# Patient Record
Sex: Female | Born: 1955 | Race: White | Hispanic: No | Marital: Married | Smoking: Never smoker
Health system: Southern US, Community
[De-identification: ages and names within clinical notes are randomized; demographics above are authoritative.]

## PROBLEM LIST (undated history)

## (undated) DIAGNOSIS — E785 Hyperlipidemia, unspecified: Secondary | ICD-10-CM

## (undated) DIAGNOSIS — F419 Anxiety disorder, unspecified: Secondary | ICD-10-CM

## (undated) DIAGNOSIS — Z789 Other specified health status: Secondary | ICD-10-CM

## (undated) DIAGNOSIS — F329 Major depressive disorder, single episode, unspecified: Secondary | ICD-10-CM

## (undated) DIAGNOSIS — F32A Depression, unspecified: Secondary | ICD-10-CM

---
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
## 1997-08-08 ENCOUNTER — Other Ambulatory Visit: Admission: RE | Admit: 1997-08-08 | Discharge: 1997-08-08 | Payer: Self-pay | Admitting: Gynecology

## 1998-02-22 ENCOUNTER — Emergency Department (HOSPITAL_COMMUNITY): Admission: EM | Admit: 1998-02-22 | Discharge: 1998-02-22 | Payer: Self-pay | Admitting: Emergency Medicine

## 1998-12-16 ENCOUNTER — Other Ambulatory Visit: Admission: RE | Admit: 1998-12-16 | Discharge: 1998-12-16 | Payer: Self-pay | Admitting: Gynecology

## 2000-01-18 ENCOUNTER — Other Ambulatory Visit: Admission: RE | Admit: 2000-01-18 | Discharge: 2000-01-18 | Payer: Self-pay | Admitting: Gynecology

## 2000-06-02 ENCOUNTER — Encounter (INDEPENDENT_AMBULATORY_CARE_PROVIDER_SITE_OTHER): Payer: Self-pay | Admitting: *Deleted

## 2000-06-02 ENCOUNTER — Ambulatory Visit (HOSPITAL_COMMUNITY): Admission: RE | Admit: 2000-06-02 | Discharge: 2000-06-02 | Payer: Self-pay | Admitting: Gastroenterology

## 2001-01-10 ENCOUNTER — Other Ambulatory Visit: Admission: RE | Admit: 2001-01-10 | Discharge: 2001-01-10 | Payer: Self-pay | Admitting: Gynecology

## 2002-01-25 ENCOUNTER — Other Ambulatory Visit: Admission: RE | Admit: 2002-01-25 | Discharge: 2002-01-25 | Payer: Self-pay | Admitting: Gynecology

## 2003-07-31 ENCOUNTER — Other Ambulatory Visit: Admission: RE | Admit: 2003-07-31 | Discharge: 2003-07-31 | Payer: Self-pay | Admitting: Gynecology

## 2004-08-20 ENCOUNTER — Other Ambulatory Visit: Admission: RE | Admit: 2004-08-20 | Discharge: 2004-08-20 | Payer: Self-pay | Admitting: Gynecology

## 2004-09-24 ENCOUNTER — Ambulatory Visit (HOSPITAL_COMMUNITY): Admission: RE | Admit: 2004-09-24 | Discharge: 2004-09-24 | Payer: Self-pay | Admitting: Gynecology

## 2004-09-24 ENCOUNTER — Encounter (INDEPENDENT_AMBULATORY_CARE_PROVIDER_SITE_OTHER): Payer: Self-pay | Admitting: *Deleted

## 2005-08-30 ENCOUNTER — Other Ambulatory Visit: Admission: RE | Admit: 2005-08-30 | Discharge: 2005-08-30 | Payer: Self-pay | Admitting: Gynecology

## 2006-03-09 ENCOUNTER — Ambulatory Visit (HOSPITAL_COMMUNITY): Admission: RE | Admit: 2006-03-09 | Discharge: 2006-03-09 | Payer: Self-pay | Admitting: Gastroenterology

## 2006-03-09 ENCOUNTER — Encounter (INDEPENDENT_AMBULATORY_CARE_PROVIDER_SITE_OTHER): Payer: Self-pay | Admitting: Specialist

## 2006-11-03 ENCOUNTER — Other Ambulatory Visit: Admission: RE | Admit: 2006-11-03 | Discharge: 2006-11-03 | Payer: Self-pay | Admitting: Gynecology

## 2007-05-30 ENCOUNTER — Other Ambulatory Visit: Admission: RE | Admit: 2007-05-30 | Discharge: 2007-05-30 | Payer: Self-pay | Admitting: Gynecology

## 2007-11-28 ENCOUNTER — Other Ambulatory Visit: Admission: RE | Admit: 2007-11-28 | Discharge: 2007-11-28 | Payer: Self-pay | Admitting: Gynecology

## 2009-01-16 ENCOUNTER — Ambulatory Visit (HOSPITAL_COMMUNITY): Admission: RE | Admit: 2009-01-16 | Discharge: 2009-01-16 | Payer: Self-pay | Admitting: Gynecology

## 2009-02-09 ENCOUNTER — Ambulatory Visit (HOSPITAL_COMMUNITY): Admission: RE | Admit: 2009-02-09 | Discharge: 2009-02-09 | Payer: Self-pay | Admitting: Urology

## 2009-12-16 ENCOUNTER — Ambulatory Visit: Payer: Self-pay | Admitting: Family Medicine

## 2009-12-16 DIAGNOSIS — M21619 Bunion of unspecified foot: Secondary | ICD-10-CM | POA: Insufficient documentation

## 2009-12-16 DIAGNOSIS — M79609 Pain in unspecified limb: Secondary | ICD-10-CM | POA: Insufficient documentation

## 2010-01-12 ENCOUNTER — Ambulatory Visit: Payer: Self-pay | Admitting: Sports Medicine

## 2010-02-02 ENCOUNTER — Ambulatory Visit: Payer: Self-pay | Admitting: Sports Medicine

## 2010-02-02 DIAGNOSIS — M2170 Unequal limb length (acquired), unspecified site: Secondary | ICD-10-CM

## 2010-05-26 NOTE — Assessment & Plan Note (Signed)
Summary: f/u,mc   Vital Signs:  Patient profile:   55 year old female Pulse rate:   66 / minute BP sitting:   113 / 71  (left arm)  Vitals Entered By: Rochele Pages RN (February 02, 2010 11:42 AM) CC: f/u lt foot pain   CC:  f/u lt foot pain.  History of Present Illness: Patient her to follow up on Left foot pain which she reports is 75% improved.   She continues to have intermittent pain with increased walking.  She has not started running yet. She is wearing the toe spacers whenever she has on enclosed shoes.   Physical Exam  General:  Well-developed,well-nourished,in no acute distress; alert,appropriate and cooperative throughout examination Msk:  Foot:  Bunions bilaterally. No other swelling evident. Increased breakdown of longitudinal arch on L foot, but breakdown in both. Pronation of L foot on standing.  No pain on flexion, extension of toes bilaterally. Mild pain to deep palpation at 2nd MTP joint, but much improved.  Leg length: 1.25 cm discrepancy L>R Neurologic:  Gait: Pelvic tilt with R lower than L. Corrected with inserts.   Impression & Recommendations:  Problem # 1:  FOOT PAIN, LEFT (ICD-729.5) Pain improved. Patient give sports insoles to take pressure off bunion and correct leg length discrepancy. She will continue to use toe spacers until swelling has been gone for 1 month. Patient will try some light walking, increasing gradually over 1-2 weeks. If she is without pain, she can then try walk/jog. 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  Problem # 3:  BUNIONS, BILATERAL (ICD-727.1)  Orders: Sports Insoles (N5621)   keep up toe spacers

## 2010-05-26 NOTE — Assessment & Plan Note (Signed)
Summary: F/U,MC   Vital Signs:  Patient profile:   55 year old female BP sitting:   129 / 84  Vitals Entered By: Lillia Pauls CMA (January 12, 2010 12:02 PM)  History of Present Illness: 1. F/U left foot pain:  Pt seen in clinic about 4 weeks ago with left foot pain and diagnosed with 2nd MTP joint capsulitis and was put in a post op shoe.  She thinks that she is a little bit better.  Main improvement is that she is not in pain as often as she was before.  The pain is still on the plantar aspect of her foot at the 2nd MTP.  Hurts to put pressure on that spot and when pushing off when walking.  Social History: Reviewed history from 12/16/2009 and no changes required. diet[facility], [facility]  Physical Exam  General:  vitals reviewed.  well appearing, no acute distress Msk:  Left foot: ttp over dorsal aspect of 2nd MTP joint and with loading the joint.  Full ROM of the joint to flexion and extension.  No obvious swelling, erythema, or deformity.  Pain reproduced with resisted 2nd toe flexion.  Bilateral bunions Calluses at the distal point of the second toes   Additional Exam:  U/S exam:  moderate 2nd MTP joint effusion, greater on the left compared to the right.  No evidence of stress fracture.   Impression & Recommendations:  Problem # 1:  FOOT PAIN, LEFT (ICD-729.5) Assessment Improved  Slightly improved compared to last visit.  Still consistent with 2 MTP capsulitis.  Provided pt with toe spacers for both feet.  This seemed to take some of the pressure off of the second toe.  Will have pt come back for orthotics.  Orders: [place] LIMITED (16109)  Problem # 2:  BUNIONS, BILATERAL (ICD-727.1) her gait shows pronation at forefoot  prob needs to get into custom  orthotic  OK to wean out of cast shoe as tolerated

## 2010-05-26 NOTE — Assessment & Plan Note (Signed)
Summary: NP,FOOT PAIN,MC   Vital Signs:  Patient profile:   55 year old female Height:      62.5 inches Weight:      115 pounds BMI:     20.77 BP sitting:   117 / 77  Vitals Entered By: Enid Baas MD (December 16, 2009 9:53 AM)  History of Present Illness: Pt here for left foot pain for 3 months. She has some pain at the 2nd met. She is in a running club. She is running about 3 miles, 3 times a week. She notes the outside edge of her tread is worn. She notes her pain is when she starts running and tapers off then it hurts when she stops running.  primarily, the patient complained of pain with flexion and extension of the second metatarsal phalangeal joint. No pain along the shaft of the metatarsal. No significant swelling. No ecchymosis. No distinct from that she can recall.  Prominent bunion.  Social History: dietitian, [facility]  Review of Systems General:  Denies chills, fatigue, fever, loss of appetite, malaise, sleep disorder, sweats, weakness, and weight loss. MS:  Complains of joint pain; denies joint redness, joint swelling, loss of strength, low back pain, mid back pain, muscle aches, muscle , cramps, muscle weakness, stiffness, and thoracic pain.  Physical Exam  General:  Well-developed,well-nourished,in no acute distress; alert,appropriate and cooperative throughout examination Msk:  Ankle: ttp over 2nd MTP joint and with loading the joint  No visible erythema or swelling. Range of motion is full in all directions. Strength is 5/5 in all directions. Stable lateral and medial ligaments; squeeze test and kleiger test unremarkable; Talar dome nontender; No pain at base of 5th MT; No tenderness over cuboid; No tenderness over N spot or navicular prominence No tenderness on posterior aspects of lateral and medial malleolus No sign of peroneal tendon subluxations; Negative tarsal tunnel tinel's Able to walk 4 steps.   Neurologic:  No cranial nerve deficits  noted. Station and gait are normal. Plantar reflexes are down-going bilaterally. DTRs are symmetrical throughout. Sensory, motor and coordinative functions appear intact.   Impression & Recommendations:  Problem # 1:  FOOT PAIN, LEFT (ICD-729.5)  Likely capsulitis of the 2nd MTP joint. 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 She is instructed to wear the post op shoe for the next few weeks and f/u then. Hopefully that will have calmed down to be able to consider orthotics at that point. She needs to use the shoe when she is at work primarily and to stop running for now. She can cross train if she wishes. Conservative mgmt of bunions for now.  Orders: Post-op Shoe (L3260)  Problem # 2:  BUNIONS, BILATERAL (ICD-727.1)

## 2010-05-28 ENCOUNTER — Other Ambulatory Visit: Payer: Self-pay | Admitting: Gastroenterology

## 2010-05-28 ENCOUNTER — Ambulatory Visit (HOSPITAL_COMMUNITY)
Admission: RE | Admit: 2010-05-28 | Discharge: 2010-05-28 | Disposition: A | Discharge status: Home / Self Care | Payer: 59 | Source: Ambulatory Visit | Attending: Gastroenterology | Admitting: Gastroenterology

## 2010-05-28 DIAGNOSIS — D126 Benign neoplasm of colon, unspecified: Secondary | ICD-10-CM | POA: Insufficient documentation

## 2010-06-09 NOTE — Op Note (Signed)
  NAME:  SHERE, EISENHART NO.:  [id number]  MEDICAL RECORD NO.:  [id number]           PATIENT TYPE:  O  LOCATION:  [facility]                         FACILITY:  [facility]  PHYSICIAN:  Danise Edge, M.D.   DATE OF BIRTH:  [birth year]  DATE OF PROCEDURE:  05/28/2010 DATE OF DISCHARGE:                              OPERATIVE REPORT   PROCEDURE:  Surveillance colonosocpy with colon polyps removal.  REFERRING PHYSICIAN:  Thora Lance, M.D.  HISTORY:  Ms. Barbara Greene is a 55 year old female scheduled to undergo surveillance colonoscopy with polypectomy to prevent colon cancer.  She underwent colonoscopies in 2002 and in 2007.  Small adenomatous colon polyps were removed during each colonoscopy.  ENDOSCOPIST:  Danise Edge, M.D.  PREMEDICATIONS: 1. Fentanyl 100 mcg. 2. Versed 7 mg.  DESCRIPTION OF PROCEDURE:  After obtaining informed consent, the patient was placed in the left lateral decubitus position.  I administered intravenous fentanyl and intravenous Versed to achieve conscious sedation for the procedure.  The patient's blood pressure, oxygen saturation and cardiac rhythm were monitored throughout the procedure and documented in the medical record.  Anal inspection and digital rectal exam were normal.  The Pentax pediatric colonoscope was introduced into the rectum and easily advanced to the cecum.  A normal-appearing ileocecal valve and appendiceal orifice were identified.  Colonic preparation for the exam today was good.  Rectum normal.  Retroflex view of the distal rectum normal.  Sigmoid colon:  From the mid sigmoid colon, a 3-mm sessile polyp was removed with cold biopsy forceps.  Descending colon normal.  Splenic flexure normal.  Transverse colon normal.  Hepatic flexure normal.  Ascending colon normal.  Cecum and ileocecal valve normal.  ASSESSMENT: 1. A small polyp was removed from the mid sigmoid colon with cold     biopsy forceps. 2.  Otherwise normal surveillance proctocolonoscopy to the cecum.  RECOMMENDATIONS:  Repeat surveillance colonoscopy in approximately 5 years.          ______________________________ Danise Edge, M.D.     MJ/MEDQ  D:  05/28/2010  T:  05/28/2010  Job:  [id number]  cc:   Thora Lance, M.D. Fax: [phone]  Electronically Signed by Danise Edge M.D. on 06/09/2010 02:37:55 PM

## 2010-09-11 NOTE — Op Note (Signed)
NAME:  LENAH, MESSENGER            ACCOUNT NO.:  [id number]   MEDICAL RECORD NO.:  [id number]          PATIENT TYPE:  AMB   LOCATION:  DAY                          [facility]ACILITY[facility]  [facility]   PHYSICIAN:  Howard C. Mezer, M.D.  DATE OF BIRTH:  [birth year]   DATE OF PROCEDURE:  09/24/2004  DATE OF DISCHARGE:                                 OPERATIVE REPORT   PREOPERATIVE DIAGNOSES:  Endometrial polyp with menorrhagia.   POSTOPERATIVE DIAGNOSES:  Endometrial polyp with menorrhagia.   OPERATION PERFORMED:  Hysteroscopy D&C.   SURGEON:  Leatha Gilding. Mezer, M.D.   ANESTHESIA:  MAC plus paracervical block.   PREPARATION:  Betadine.   DESCRIPTION OF PROCEDURE:  With the patient in the lithotomy position, she  was prepped and draped in routine fashion. A paracervical block of 1%  Xylocaine was placed without difficulty. The cervix was somewhat difficult  to dilate as there was a false passage that was encountered with each of the  dilators but easily avoided. The hysteroscope was introduced and [place] was  used a distention medium. The endocervical canal appeared to be normal and  visualization of the endometrial cavity was less than optimal secondary to  equipment. The view was quite hazy resulting in poor quality pictures. A  good feel was obtained, however, for the endometrial cavity. There was a  small amount of polypoid appearing tissue although the cavity was somewhat  irregular suggestive of fibroids. The hysteroscope was removed, the cavity  sharply curetted productive of a moderate amount of tissue. The cavity was  quite irregular on curettage. Randall stone forceps were introduced and  minimal additional tissue was identified. There was minimal bleeding at the  end of the procedure. The estimated blood loss was less than 20 mL. The  sponge, needle and instrument counts were correct. The patient tolerated the  procedure well and was taken to the recovery room in satisfactory  condition.      HCM/MEDQ  D:  09/24/2004  T:  09/24/2004  Job:  [id number]   cc:   Thora Lance, M.D.  [address]  [place]  [place] [address]  Fax: [phone]

## 2012-08-23 ENCOUNTER — Other Ambulatory Visit: Payer: Self-pay | Admitting: Dermatology

## 2013-04-16 ENCOUNTER — Other Ambulatory Visit: Payer: Self-pay | Admitting: Dermatology

## 2013-07-16 ENCOUNTER — Other Ambulatory Visit: Payer: Self-pay | Admitting: Gynecology

## 2013-11-28 ENCOUNTER — Other Ambulatory Visit: Payer: Self-pay | Admitting: Gynecology

## 2013-11-30 LAB — CYTOLOGY - PAP

## 2014-07-25 ENCOUNTER — Other Ambulatory Visit: Payer: Self-pay | Admitting: Gynecology

## 2014-07-26 LAB — CYTOLOGY - PAP

## 2015-01-01 ENCOUNTER — Other Ambulatory Visit: Payer: Self-pay | Admitting: Gynecology

## 2015-01-02 LAB — CYTOLOGY - PAP

## 2015-01-09 ENCOUNTER — Other Ambulatory Visit: Payer: Self-pay | Admitting: Urology

## 2015-01-09 DIAGNOSIS — N281 Cyst of kidney, acquired: Secondary | ICD-10-CM

## 2015-01-20 ENCOUNTER — Ambulatory Visit (HOSPITAL_COMMUNITY)
Admission: RE | Admit: 2015-01-20 | Discharge: 2015-01-20 | Disposition: A | Discharge status: Home / Self Care | Payer: 59 | Source: Ambulatory Visit | Attending: Urology | Admitting: Urology

## 2015-03-03 ENCOUNTER — Ambulatory Visit (HOSPITAL_COMMUNITY)
Admission: RE | Admit: 2015-03-03 | Discharge: 2015-03-03 | Disposition: A | Discharge status: Home / Self Care | Payer: 59 | Source: Ambulatory Visit | Attending: Urology | Admitting: Urology

## 2015-03-03 DIAGNOSIS — N289 Disorder of kidney and ureter, unspecified: Secondary | ICD-10-CM | POA: Insufficient documentation

## 2015-03-03 DIAGNOSIS — N281 Cyst of kidney, acquired: Secondary | ICD-10-CM | POA: Insufficient documentation

## 2015-03-03 LAB — POCT I-STAT CREATININE: CREATININE: 0.8 mg/dL (ref 0.44–1.00)

## 2015-03-03 MED ORDER — GADOBENATE DIMEGLUMINE 529 MG/ML IV SOLN
15.0000 mL | Freq: Once | INTRAVENOUS | Status: AC | PRN
  Administered 2015-03-03: 11 mL via INTRAVENOUS

## 2015-05-26 DIAGNOSIS — N281 Cyst of kidney, acquired: Secondary | ICD-10-CM | POA: Diagnosis not present

## 2015-05-26 DIAGNOSIS — R3121 Asymptomatic microscopic hematuria: Secondary | ICD-10-CM | POA: Diagnosis not present

## 2015-05-26 DIAGNOSIS — Z0000 Encounter for general adult medical examination without abnormal findings: Secondary | ICD-10-CM | POA: Diagnosis not present

## 2015-06-10 DIAGNOSIS — L308 Other specified dermatitis: Secondary | ICD-10-CM | POA: Diagnosis not present

## 2015-06-10 MED FILL — DESONIDE 0.05% OINTMENT: 0.05 | 30 days supply | Qty: 15 | Fill #0

## 2015-06-10 MED FILL — CLOBETASOL 0.05% OINTMENT: 0.05 | 20 days supply | Qty: 30 | Fill #0

## 2015-06-23 MED FILL — ESCITALOPRAM 10 MG TABLET: 10 | 90 days supply | Qty: 90 | Fill #0

## 2015-09-04 DIAGNOSIS — H2513 Age-related nuclear cataract, bilateral: Secondary | ICD-10-CM | POA: Diagnosis not present

## 2015-09-04 DIAGNOSIS — H35411 Lattice degeneration of retina, right eye: Secondary | ICD-10-CM | POA: Diagnosis not present

## 2015-09-04 DIAGNOSIS — D3131 Benign neoplasm of right choroid: Secondary | ICD-10-CM | POA: Diagnosis not present

## 2015-09-04 DIAGNOSIS — H35412 Lattice degeneration of retina, left eye: Secondary | ICD-10-CM | POA: Diagnosis not present

## 2015-09-05 DIAGNOSIS — Z1231 Encounter for screening mammogram for malignant neoplasm of breast: Secondary | ICD-10-CM | POA: Diagnosis not present

## 2015-09-05 DIAGNOSIS — Z13820 Encounter for screening for osteoporosis: Secondary | ICD-10-CM | POA: Diagnosis not present

## 2015-09-05 DIAGNOSIS — Z6822 Body mass index (BMI) 22.0-22.9, adult: Secondary | ICD-10-CM | POA: Diagnosis not present

## 2015-09-05 DIAGNOSIS — Z01419 Encounter for gynecological examination (general) (routine) without abnormal findings: Secondary | ICD-10-CM | POA: Diagnosis not present

## 2016-01-19 DIAGNOSIS — M21611 Bunion of right foot: Secondary | ICD-10-CM | POA: Diagnosis not present

## 2016-01-19 DIAGNOSIS — M21612 Bunion of left foot: Secondary | ICD-10-CM | POA: Diagnosis not present

## 2016-01-19 DIAGNOSIS — E559 Vitamin D deficiency, unspecified: Secondary | ICD-10-CM | POA: Diagnosis not present

## 2016-01-19 DIAGNOSIS — Z86010 Personal history of colonic polyps: Secondary | ICD-10-CM | POA: Diagnosis not present

## 2016-01-19 DIAGNOSIS — E7800 Pure hypercholesterolemia, unspecified: Secondary | ICD-10-CM | POA: Diagnosis not present

## 2016-01-19 DIAGNOSIS — Z0000 Encounter for general adult medical examination without abnormal findings: Secondary | ICD-10-CM | POA: Diagnosis not present

## 2016-01-20 MED FILL — ATORVASTATIN 40 MG TABLET: 40 | 30 days supply | Qty: 30 | Fill #0

## 2016-01-26 DIAGNOSIS — M21612 Bunion of left foot: Secondary | ICD-10-CM | POA: Diagnosis not present

## 2016-01-26 DIAGNOSIS — M2011 Hallux valgus (acquired), right foot: Secondary | ICD-10-CM | POA: Diagnosis not present

## 2016-01-26 DIAGNOSIS — M2012 Hallux valgus (acquired), left foot: Secondary | ICD-10-CM | POA: Diagnosis not present

## 2016-01-26 DIAGNOSIS — M21611 Bunion of right foot: Secondary | ICD-10-CM | POA: Diagnosis not present

## 2016-01-29 ENCOUNTER — Other Ambulatory Visit: Payer: Self-pay | Admitting: Gastroenterology

## 2016-02-24 ENCOUNTER — Other Ambulatory Visit: Payer: Self-pay | Admitting: Gastroenterology

## 2016-03-01 ENCOUNTER — Encounter (HOSPITAL_COMMUNITY)
Admission: RE | Disposition: A | Discharge status: Home / Self Care | Payer: Self-pay | Source: Ambulatory Visit | Attending: Gastroenterology

## 2016-03-01 SURGERY — COLONOSCOPY WITH PROPOFOL
Anesthesia: Monitor Anesthesia Care

## 2016-03-04 ENCOUNTER — Other Ambulatory Visit: Payer: Self-pay | Admitting: Gastroenterology

## 2016-03-17 ENCOUNTER — Other Ambulatory Visit: Payer: Self-pay | Admitting: Gastroenterology

## 2016-03-23 MED FILL — ATORVASTATIN 40 MG TABLET: 40 | 90 days supply | Qty: 90 | Fill #1

## 2016-03-29 DIAGNOSIS — D1801 Hemangioma of skin and subcutaneous tissue: Secondary | ICD-10-CM | POA: Diagnosis not present

## 2016-03-29 DIAGNOSIS — Z85820 Personal history of malignant melanoma of skin: Secondary | ICD-10-CM | POA: Diagnosis not present

## 2016-03-29 DIAGNOSIS — L821 Other seborrheic keratosis: Secondary | ICD-10-CM | POA: Diagnosis not present

## 2016-03-29 DIAGNOSIS — D225 Melanocytic nevi of trunk: Secondary | ICD-10-CM | POA: Diagnosis not present

## 2016-03-29 DIAGNOSIS — D2272 Melanocytic nevi of left lower limb, including hip: Secondary | ICD-10-CM | POA: Diagnosis not present

## 2016-03-29 DIAGNOSIS — D224 Melanocytic nevi of scalp and neck: Secondary | ICD-10-CM | POA: Diagnosis not present

## 2016-04-07 DIAGNOSIS — S61451A Open bite of right hand, initial encounter: Secondary | ICD-10-CM | POA: Diagnosis not present

## 2016-04-07 DIAGNOSIS — W5501XA Bitten by cat, initial encounter: Secondary | ICD-10-CM | POA: Diagnosis not present

## 2016-04-07 MED FILL — AMOX-CLAV 500-125 MG TABLET: 500-125 | 10 days supply | Qty: 20 | Fill #0

## 2016-04-12 ENCOUNTER — Other Ambulatory Visit: Payer: Self-pay | Admitting: Orthopedic Surgery

## 2016-04-13 ENCOUNTER — Encounter (HOSPITAL_COMMUNITY)
Admission: RE | Disposition: A | Discharge status: Home / Self Care | Payer: Self-pay | Source: Ambulatory Visit | Attending: Gastroenterology

## 2016-04-13 SURGERY — COLONOSCOPY WITH PROPOFOL
Anesthesia: Monitor Anesthesia Care

## 2016-05-12 ENCOUNTER — Ambulatory Visit (HOSPITAL_BASED_OUTPATIENT_CLINIC_OR_DEPARTMENT_OTHER): Admit: 2016-05-12 | Payer: 59 | Admitting: Orthopedic Surgery

## 2016-05-12 ENCOUNTER — Encounter (HOSPITAL_BASED_OUTPATIENT_CLINIC_OR_DEPARTMENT_OTHER): Payer: Self-pay

## 2016-05-12 SURGERY — BUNIONECTOMY
Anesthesia: Choice | Laterality: Left

## 2016-06-03 MED FILL — SUPREP BOWEL PREP KIT: 17.5-3.13-1 | 1 days supply | Qty: 354 | Fill #0

## 2016-06-04 ENCOUNTER — Encounter (HOSPITAL_COMMUNITY): Payer: Self-pay | Admitting: *Deleted

## 2016-06-08 ENCOUNTER — Encounter (HOSPITAL_COMMUNITY): Payer: Self-pay

## 2016-06-08 ENCOUNTER — Ambulatory Visit (HOSPITAL_COMMUNITY)
Admission: RE | Admit: 2016-06-08 | Discharge: 2016-06-08 | Disposition: A | Discharge status: Home / Self Care | Payer: 59 | Source: Ambulatory Visit | Attending: Gastroenterology | Admitting: Gastroenterology

## 2016-06-08 ENCOUNTER — Ambulatory Visit (HOSPITAL_COMMUNITY): Payer: 59 | Admitting: Anesthesiology

## 2016-06-08 ENCOUNTER — Encounter (HOSPITAL_COMMUNITY)
Admission: RE | Disposition: A | Discharge status: Home / Self Care | Payer: Self-pay | Source: Ambulatory Visit | Attending: Gastroenterology

## 2016-06-08 DIAGNOSIS — M79672 Pain in left foot: Secondary | ICD-10-CM | POA: Diagnosis not present

## 2016-06-08 DIAGNOSIS — K635 Polyp of colon: Secondary | ICD-10-CM | POA: Diagnosis not present

## 2016-06-08 DIAGNOSIS — Z1211 Encounter for screening for malignant neoplasm of colon: Secondary | ICD-10-CM | POA: Insufficient documentation

## 2016-06-08 DIAGNOSIS — D125 Benign neoplasm of sigmoid colon: Secondary | ICD-10-CM | POA: Diagnosis not present

## 2016-06-08 DIAGNOSIS — K562 Volvulus: Secondary | ICD-10-CM | POA: Diagnosis not present

## 2016-06-08 DIAGNOSIS — Z85820 Personal history of malignant melanoma of skin: Secondary | ICD-10-CM | POA: Diagnosis not present

## 2016-06-08 DIAGNOSIS — E7800 Pure hypercholesterolemia, unspecified: Secondary | ICD-10-CM | POA: Insufficient documentation

## 2016-06-08 DIAGNOSIS — M21612 Bunion of left foot: Secondary | ICD-10-CM | POA: Diagnosis not present

## 2016-06-08 DIAGNOSIS — Z86010 Personal history of colonic polyps: Secondary | ICD-10-CM | POA: Insufficient documentation

## 2016-06-08 DIAGNOSIS — M21611 Bunion of right foot: Secondary | ICD-10-CM | POA: Diagnosis not present

## 2016-06-08 HISTORY — DX: Other specified health status: Z78.9

## 2016-06-08 HISTORY — PX: COLONOSCOPY WITH PROPOFOL: SHX5780

## 2016-06-08 SURGERY — COLONOSCOPY WITH PROPOFOL
Anesthesia: Monitor Anesthesia Care

## 2016-06-08 MED ORDER — PROPOFOL 10 MG/ML IV BOLUS
INTRAVENOUS | Status: AC
  Filled 2016-06-08: qty 20

## 2016-06-08 MED ORDER — LIDOCAINE 2% (20 MG/ML) 5 ML SYRINGE
INTRAMUSCULAR | Status: DC | PRN
  Administered 2016-06-08: 50 mg via INTRAVENOUS

## 2016-06-08 MED ORDER — LACTATED RINGERS IV SOLN
INTRAVENOUS | Status: DC | PRN
  Administered 2016-06-08: 09:00:00 via INTRAVENOUS

## 2016-06-08 MED ORDER — PROPOFOL 10 MG/ML IV BOLUS
INTRAVENOUS | Status: AC
  Filled 2016-06-08: qty 40

## 2016-06-08 MED ORDER — PROPOFOL 10 MG/ML IV BOLUS
INTRAVENOUS | Status: DC | PRN
  Administered 2016-06-08 (×2): 30 mg via INTRAVENOUS
  Administered 2016-06-08: 20 mg via INTRAVENOUS
  Administered 2016-06-08: 40 mg via INTRAVENOUS
  Administered 2016-06-08: 30 mg via INTRAVENOUS
  Administered 2016-06-08: 20 mg via INTRAVENOUS

## 2016-06-08 MED ORDER — PROPOFOL 500 MG/50ML IV EMUL
INTRAVENOUS | Status: DC | PRN
  Administered 2016-06-08: 100 ug/kg/min via INTRAVENOUS

## 2016-06-08 SURGICAL SUPPLY — 22 items

## 2016-06-08 NOTE — Anesthesia Preprocedure Evaluation (Signed)
Anesthesia Evaluation  Patient identified by MRN, date of birth, ID band Patient awake    Reviewed: Allergy & Precautions, NPO status , Patient's Chart, lab work & pertinent test results  Airway Mallampati: II  TM Distance: >3 FB Neck ROM: Full    Dental  (+) Dental Advisory Given   Pulmonary neg pulmonary ROS,    breath sounds clear to auscultation       Cardiovascular negative cardio ROS   Rhythm:Regular Rate:Normal     Neuro/Psych negative neurological ROS     GI/Hepatic negative GI ROS, Neg liver ROS,   Endo/Other  negative endocrine ROS  Renal/GU negative Renal ROS     Musculoskeletal   Abdominal   Peds  Hematology negative hematology ROS (+)   Anesthesia Other Findings   Reproductive/Obstetrics                             Anesthesia Physical Anesthesia Plan  ASA: I  Anesthesia Plan: MAC   Post-op Pain Management:    Induction: Intravenous  Airway Management Planned: Natural Airway and Simple Face Mask  Additional Equipment:   Intra-op Plan:   Post-operative Plan:   Informed Consent: I have reviewed the patients History and Physical, chart, labs and discussed the procedure including the risks, benefits and alternatives for the proposed anesthesia with the patient or authorized representative who has indicated his/her understanding and acceptance.     Plan Discussed with:   Anesthesia Plan Comments:         Anesthesia Quick Evaluation

## 2016-06-08 NOTE — H&P (Signed)
Procedure: Surveillance colonoscopy. 05/28/2010 normal surveillance colonoscopy was performed. History of small adenomatous colon polyps removed colonoscopically in 2002 and in 2007. Father was diagnosed with rectal cancer in his 70s. Sister was diagnosed with colon polyps  History: The patient is a 61 year old female born [birth year]. She is scheduled to undergo a surveillance colonoscopy today.  Past medical history: Hypercholesterolemia. Migraine headache syndrome. Cesarean sections performed in 1983 and 1987. 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  Medication allergies:  Exam: The patient is alert and lying comfortably on the endoscopy stretcher. Abdomen is soft and nontender to palpation. Lungs are clear to auscultation. Cardiac exam reveals a regular rhythm.  Proceed with surveillance colonoscopy

## 2016-06-08 NOTE — Anesthesia Postprocedure Evaluation (Signed)
Anesthesia Post Note  Patient: Barbara Greene  Procedure(s) Performed: Procedure(s) (LRB): COLONOSCOPY WITH PROPOFOL (N/A)  Patient location during evaluation: PACU Anesthesia Type: MAC Level of consciousness: awake and alert Pain management: pain level controlled Vital Signs Assessment: post-procedure vital signs reviewed and stable Respiratory status: spontaneous breathing, nonlabored ventilation, respiratory function stable and patient connected to nasal cannula oxygen Cardiovascular status: stable and blood pressure returned to baseline Anesthetic complications: no       Last Vitals:  Vitals:   06/08/16 0749 06/08/16 0945  BP: (!) 158/66 (!) 109/51  Pulse: 81 74  Resp: (!) 21 18  Temp: 36.7 C     Last Pain:  Vitals:   06/08/16 0749  TempSrc: Leatrice Jewels                 Tiajuana Amass

## 2016-06-08 NOTE — Discharge Instructions (Signed)

## 2016-06-08 NOTE — Op Note (Signed)
[facility] Patient Name: Barbara Greene Procedure Date: 06/08/2016 MRN: [id number] Attending MD: Garlan Fair , MD Date of Birth: [birth year] CSN: [id number] Age: 61 Admit Type: Outpatient Procedure:                Colonoscopy Indications:              High risk colon cancer surveillance: Personal                            history of non-advanced adenoma Providers:                Garlan Fair, MD, Cleda Daub, RN, Cletis Athens, Technician Referring MD:              Medicines:                Propofol per Anesthesia Complications:            No immediate complications. Estimated Blood Loss:     Estimated blood loss was minimal. Procedure:                Pre-Anesthesia Assessment:                           - Prior to the procedure, a History and Physical                            was performed, and patient medications and                            allergies were reviewed. The patient's tolerance of                            previous anesthesia was also reviewed. The risks                            and benefits of the procedure and the sedation                            options and risks were discussed with the patient.                            All questions were answered, and informed consent                            was obtained. Prior Anticoagulants: The patient has                            taken no previous anticoagulant or antiplatelet                            agents. ASA Grade Assessment: II - A patient with  mild systemic disease. After reviewing the risks                            and benefits, the patient was deemed in                            satisfactory condition to undergo the procedure.                           After obtaining informed consent, the colonoscope                            was passed under direct vision. Throughout the                            procedure, the  patient's blood pressure, pulse, and                            oxygen saturations were monitored continuously. The                            [id number]) scope was introduced through                            the anus and advanced to the the cecum, identified                            by appendiceal orifice and ileocecal valve. The                            colonoscopy was technically difficult and complex                            due to significant looping. The patient tolerated                            the procedure well. The quality of the bowel                            preparation was good. The appendiceal orifice and                            the rectum were photographed. Findings:      The perianal and digital rectal examinations were normal.      A 5 mm polyp was found in the mid sigmoid colon. The polyp was sessile.       The polyp was removed with a cold snare. Resection and retrieval were       complete.      The exam was otherwise without abnormality. Impression:               - One 5 mm polyp in the mid sigmoid colon, removed  with a cold snare. Resected and retrieved.                           - The examination was otherwise normal. Moderate Sedation:      N/A- Per Anesthesia Care Recommendation:           - Patient has a contact number available for                            emergencies. The signs and symptoms of potential                            delayed complications were discussed with the                            patient. Return to normal activities tomorrow.                            Written discharge instructions were provided to the                            patient.                           - Repeat colonoscopy in 5 years for surveillance.                           - Resume previous diet.                           - Continue present medications. Procedure Code(s):        --- Professional ---                            [id number], Colonoscopy, flexible; with removal of                            tumor(s), polyp(s), or other lesion(s) by snare                            technique Diagnosis Code(s):        --- Professional ---                           Z86.010, Personal history of colonic polyps                           D12.5, Benign neoplasm of sigmoid colon CPT copyright 2016 [employer]. All rights reserved. The codes documented in this report are preliminary and upon coder review may  be revised to meet current compliance requirements. Earle Gell, MD Garlan Fair, MD 06/08/2016 9:43:42 AM This report has been signed electronically. Number of Addenda: 0

## 2016-06-08 NOTE — Transfer of Care (Signed)
Immediate Anesthesia Transfer of Care Note  Patient: MONCIA ANNAS  Procedure(s) Performed: Procedure(s): COLONOSCOPY WITH PROPOFOL (N/A)  Patient Location: PACU  Anesthesia Type:MAC  Level of Consciousness: Patient easily awoken, sedated, comfortable, cooperative, following commands, responds to stimulation.   Airway & Oxygen Therapy: Patient spontaneously breathing, ventilating well, oxygen via simple oxygen mask.  Post-op Assessment: Report given to PACU RN, vital signs reviewed and stable, moving all extremities.   Post vital signs: Reviewed and stable.  Complications: No apparent anesthesia complications Last Vitals:  Vitals:   06/08/16 0749  BP: (!) 158/66  Pulse: 81  Resp: (!) 21  Temp: 36.7 C    Last Pain:  Vitals:   06/08/16 0749  TempSrc: Oral         Complications: No apparent anesthesia complications

## 2016-06-11 ENCOUNTER — Encounter (HOSPITAL_COMMUNITY): Payer: Self-pay | Admitting: Gastroenterology

## 2016-07-29 MED FILL — ATORVASTATIN 40 MG TABLET: 40 | 90 days supply | Qty: 90 | Fill #2

## 2016-08-03 DIAGNOSIS — H524 Presbyopia: Secondary | ICD-10-CM | POA: Diagnosis not present

## 2016-09-02 DIAGNOSIS — H35411 Lattice degeneration of retina, right eye: Secondary | ICD-10-CM | POA: Diagnosis not present

## 2016-09-02 DIAGNOSIS — D3131 Benign neoplasm of right choroid: Secondary | ICD-10-CM | POA: Diagnosis not present

## 2016-09-02 DIAGNOSIS — H35412 Lattice degeneration of retina, left eye: Secondary | ICD-10-CM | POA: Diagnosis not present

## 2016-09-02 DIAGNOSIS — H2513 Age-related nuclear cataract, bilateral: Secondary | ICD-10-CM | POA: Diagnosis not present

## 2016-09-09 DIAGNOSIS — R29898 Other symptoms and signs involving the musculoskeletal system: Secondary | ICD-10-CM | POA: Diagnosis not present

## 2016-09-09 DIAGNOSIS — Z79899 Other long term (current) drug therapy: Secondary | ICD-10-CM | POA: Diagnosis not present

## 2016-09-09 DIAGNOSIS — E7800 Pure hypercholesterolemia, unspecified: Secondary | ICD-10-CM | POA: Diagnosis not present

## 2016-09-09 DIAGNOSIS — M549 Dorsalgia, unspecified: Secondary | ICD-10-CM | POA: Diagnosis not present

## 2016-09-22 ENCOUNTER — Encounter: Payer: Self-pay | Admitting: Physical Therapy

## 2016-09-22 ENCOUNTER — Ambulatory Visit: Payer: 59 | Attending: Internal Medicine | Admitting: Physical Therapy

## 2016-09-22 DIAGNOSIS — M542 Cervicalgia: Secondary | ICD-10-CM

## 2016-09-22 DIAGNOSIS — M6281 Muscle weakness (generalized): Secondary | ICD-10-CM

## 2016-09-22 DIAGNOSIS — M62838 Other muscle spasm: Secondary | ICD-10-CM | POA: Diagnosis not present

## 2016-09-23 NOTE — Therapy (Signed)
[place], [place], [address] Phone: [phone]   Fax:  [phone]  Physical Therapy Evaluation  Patient Details  Name: Barbara Greene MRN: [id number] Date of Birth: [birth year] Referring Provider: Dr Lavone Orn   Encounter Date: 09/22/2016      PT End of Session - 09/23/16 1259    Visit Number 1   Number of Visits 12   Date for PT Re-Evaluation 11/04/16   Authorization Type [facility]    PT Start Time 1884   PT Stop Time 1720   PT Time Calculation (min) 45 min   Activity Tolerance Patient tolerated treatment well   Behavior During Therapy [facility] for tasks assessed/performed      Past Medical History:  Diagnosis Date  . Medical history non-contributory     Past Surgical History:  Procedure Laterality Date  . CESAREAN SECTION    . COLONOSCOPY WITH PROPOFOL N/A 06/08/2016   Procedure: COLONOSCOPY WITH PROPOFOL;  Surgeon: Garlan Fair, MD;  Location: [facility];  Service: Endoscopy;  Laterality: N/A;    There were no vitals filed for this visit.       Subjective Assessment - 09/22/16 1646    Subjective Patient reports over the past 3-4 months the patient had increased pain in her cervical spine and weakness into her left hand. She changed her jobs to a job were she sits more throughout the day. She has to reach for her keyboard.    Limitations --  using a keyboard for long periods    How long can you sit comfortably? deppends on her activity    How long can you stand comfortably? N/A    How long can you walk comfortably? N/A    Diagnostic tests N/A    Currently in Pain? Yes   Pain Score 3    Pain Location Arm   Pain Orientation Left   Pain Descriptors / Indicators Aching   Pain Type Chronic pain   Pain Radiating Towards  left arm into the thumb and forefinger   Pain Onset More than a month ago   Pain Frequency Constant   Aggravating Factors  use of the arm and using the computer     Pain Relieving Factors rest    Multiple Pain Sites No            [facility] PT Assessment - 09/23/16 0001      Assessment   Medical Diagnosis Cervical pain into mid back and left arm    Referring Provider Dr Lavone Orn    Onset Date/Surgical Date --  Early Arpil 16 late march    Hand Dominance Right   Next MD Visit 2019   Prior Therapy None      Precautions   Precautions None     Restrictions   Weight Bearing Restrictions No     Balance Screen   Has the patient fallen in the past 6 months No   Has the patient had a decrease in activity level because of a fear of falling?  No   Is the patient reluctant to leave their home because of a fear of falling?  No     Home Environment   Additional Comments Nothing pertinent      Prior Function   Level of Independence Independent   Vocation Full time employment   Vocation Requirements Works as Electrical engineer for [employer], [employer] ,      Associate Professor  Overall Cognitive Status Within Functional Limits for tasks assessed   Attention Focused   Focused Attention Appears intact   Memory Appears intact   Awareness Appears intact   Problem Solving Appears intact     Observation/Other Assessments   Focus on Therapeutic Outcomes (FOTO)  Not given by front      Sensation   Light Touch Appears Intact   Additional Comments reports some weakness and numbness at times into her lateral wrist.      Coordination   Gross Motor Movements are Fluid and Coordinated Yes   Fine Motor Movements are Fluid and Coordinated Yes     AROM   Cervical Flexion pain at end range    Cervical Extension pain at end range    Cervical - Right Rotation 70   Cervical - Left Rotation 50     PROM   Overall PROM Comments full PROM of shoulders      Strength   Overall Strength Comments right grip 60 lbs left 40 lbs  Left shoulder 5/5    Strength Assessment Site Shoulder   Right/Left Shoulder Right   Right Shoulder Flexion  4+/5   Right Shoulder ABduction 4+/5   Right Shoulder External Rotation 4+/5     Palpation   Palpation comment spasming of the left upper trap             Objective measurements completed on examination: See above findings.          [facility] Adult PT Treatment/Exercise - 09/23/16 0001      Neck Exercises: Standing   Other Standing Exercises trigger point release with tennis ball    Other Standing Exercises Shoulder extension yellow 2x10; scap retraction yellow 2x10      Manual Therapy   Manual therapy comments manual traction; trigger point release to cervical spine and upper trap.      Neck Exercises: Stretches   Upper Trapezius Stretch Limitations 2x20sec hold                 PT Education - 09/23/16 1256    Education provided Yes   Education Details HEP, symptom mamgement, improtance of postural strengthening    Person(s) Educated Patient   Methods Explanation;Demonstration;Tactile cues;Verbal cues   Comprehension Verbalized understanding;Returned demonstration;Verbal cues required;Tactile cues required          PT Short Term Goals - 09/23/16 1310      PT SHORT TERM GOAL #1   Title Patient will increase cervical rotation to the left by 10 degrees    Time 4   Period Weeks   Status New     PT SHORT TERM GOAL #2   Title Patient will demsotrate 5/5 gross right shoulder strength    Time 4   Period Weeks   Status New     PT SHORT TERM GOAL #3   Title Patient will demsotrate no significant tenderness to aplpation in her right upper trap   Time 4   Period Weeks   Status New     PT SHORT TERM GOAL #4   Title Patient will be independent with initial HEP    Time 4   Period Weeks   Status New           PT Long Term Goals - 09/23/16 1312      PT LONG TERM GOAL #1   Title Patient will demonstrate 5/5 scapualr strength in order to 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    Time  8   Period Weeks   Status New     PT LONG TERM GOAL #2    Title Patient will sit at her job for 4-5 hours without report of increased pain    Time 8   Period Weeks   Status New     PT LONG TERM GOAL #3   Title Patient will return to the gym with program to promote pusture    Time 8   Period Weeks   Status New     PT LONG TERM GOAL #4   Title Patient will set up her work station to promote proper posture while sitting    Time 8   Period Weeks   Status New                Plan - 09/23/16 1301    Clinical Impression Statement Patient is a 23 year year old female with left sided cervical/ mid back pain. She has weakness into her left hand. Her grip is 20ls difgferent on the left. At times she has impared sensation on the left. She has bene working on a computer more at work. Therapy reviewed her work space set up and gave her a sheet to evaluate her set up. she has minor weakness on the left compared to the right. She would benefit from skilled therapy to improve her ability to work without pain. She was seen for a low complexity evalaution.    Clinical Presentation Stable   Clinical Decision Making Low   Rehab Potential Good   PT Frequency 2x / week   PT Duration 8 weeks   PT Treatment/Interventions ADLs/Self Care Home Management;Cryotherapy;Electrical Stimulation;Iontophoresis 4mg /ml Dexamethasone;Gait training;Moist Heat;Ultrasound;Therapeutic activities;Therapeutic exercise;Patient/family education;Manual techniques;Taping;Stair training;Neuromuscular re-education;Passive range of motion   PT Next Visit Plan continue with scapualr exercises and postural correction; consdier bilateral ER with band; flexion with band abduction; Continue  manual therapy; Add standing fexion when able.    PT Home Exercise Plan scap retraction/ shoulder extension/ upper trap stretch, tennis ball trigger point release   Consulted and Agree with Plan of Care Patient      Patient will benefit from skilled therapeutic intervention in order to improve the  following deficits and impairments:  Decreased strength, Postural dysfunction, Decreased activity tolerance, Pain, Impaired UE functional use, Increased muscle spasms  Visit Diagnosis: Cervicalgia - Plan: PT plan of care cert/re-cert  Other muscle spasm - Plan: PT plan of care cert/re-cert  Muscle weakness (generalized) - Plan: PT plan of care cert/re-cert     Problem List Patient Active Problem List   Diagnosis Date Noted  . UNEQUAL LEG LENGTH 02/02/2010  . BUNIONS, BILATERAL 12/16/2009  . FOOT PAIN, LEFT 12/16/2009    [name] PT DPT  09/23/2016, 1:23 PM  [facility] [address] [place], [place], [address] Phone: [phone]   Fax:  [phone]  Name: Barbara Greene MRN: [id number] Date of Birth: [birth year]

## 2016-09-29 ENCOUNTER — Ambulatory Visit: Payer: 59 | Attending: Internal Medicine | Admitting: Physical Therapy

## 2016-09-29 DIAGNOSIS — M62838 Other muscle spasm: Secondary | ICD-10-CM | POA: Insufficient documentation

## 2016-09-29 DIAGNOSIS — M6281 Muscle weakness (generalized): Secondary | ICD-10-CM | POA: Diagnosis not present

## 2016-09-29 DIAGNOSIS — M542 Cervicalgia: Secondary | ICD-10-CM | POA: Diagnosis not present

## 2016-09-29 NOTE — Therapy (Signed)
[place], [place], [address] Phone: [phone]   Fax:  [phone]  Physical Therapy Treatment  Patient Details  Name: Barbara Greene MRN: [id number] Date of Birth: 13-Mar-1956 Referring Provider: Dr Lavone Orn   Encounter Date: 09/29/2016      PT End of Session - 09/29/16 1714    Visit Number 2   Number of Visits 12   Date for PT Re-Evaluation 11/04/16   PT Start Time 1634   PT Stop Time 1728   PT Time Calculation (min) 54 min   Activity Tolerance Patient tolerated treatment well   Behavior During Therapy [facility] for tasks assessed/performed      Past Medical History:  Diagnosis Date  . Medical history non-contributory     Past Surgical History:  Procedure Laterality Date  . CESAREAN SECTION    . COLONOSCOPY WITH PROPOFOL N/A 06/08/2016   Procedure: COLONOSCOPY WITH PROPOFOL;  Surgeon: Garlan Fair, MD;  Location: [facility];  Service: Endoscopy;  Laterality: N/A;    There were no vitals filed for this visit.                       [place] Adult PT Treatment/Exercise - 09/29/16 0001      Neck Exercises: Standing   Other Standing Exercises verball and demo correct technique for standing row and shoulder extension with band.      Shoulder Exercises: Supine   Other Supine Exercises Supine scapular stabilization exercises , HEP, 10 X with cues:  ER, Sash, both, narrow grip fleXion and ahorizontal abduction.  No pain with these however left shoulder had some pain free pops. no pops noted when I was depression area with palpations.      Moist Heat Therapy   Number Minutes Moist Heat 15 Minutes   Moist Heat Location Cervical                PT Education - 09/29/16 1714    Education provided Yes   Education Details ADLs, posture, HEP   Person(s) Educated Patient   Methods Explanation;Demonstration;Tactile cues;Verbal cues;Handout   Comprehension Verbalized  understanding;Returned demonstration;Need further instruction          PT Short Term Goals - 09/23/16 1310      PT SHORT TERM GOAL #1   Title Patient will increase cervical rotation to the left by 10 degrees    Time 4   Period Weeks   Status New     PT SHORT TERM GOAL #2   Title Patient will demsotrate 5/5 gross right shoulder strength    Time 4   Period Weeks   Status New     PT SHORT TERM GOAL #3   Title Patient will demsotrate no significant tenderness to aplpation in her right upper trap   Time 4   Period Weeks   Status New     PT SHORT TERM GOAL #4   Title Patient will be independent with initial HEP    Time 4   Period Weeks   Status New           PT Long Term Goals - 09/23/16 1312      PT LONG TERM GOAL #1   Title Patient will demonstrate 5/5 scapualr strength in order to 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    Time 8   Period Weeks   Status New     PT LONG TERM GOAL #2  Title Patient will sit at her job for 4-5 hours without report of increased pain    Time 8   Period Weeks   Status New     PT LONG TERM GOAL #3   Title Patient will return to the gym with program to promote pusture    Time 8   Period Weeks   Status New     PT LONG TERM GOAL #4   Title Patient will set up her work station to promote proper posture while sitting    Time 8   Period Weeks   Status New               Plan - 09/29/16 1719    Clinical Impression Statement Pain is decreasing when posture improves at work for short times. How ever some times she feel increased soreness.   Exercises require cues for technoique (minor).  posture improving.   Patient is open to any and all suggestions.    PT Treatment/Interventions ADLs/Self Care Home Management;Cryotherapy;Electrical Stimulation;Iontophoresis 4mg /ml Dexamethasone;Gait training;Moist Heat;Ultrasound;Therapeutic activities;Therapeutic exercise;Patient/family education;Manual techniques;Taping;Stair  training;Neuromuscular re-education;Passive range of motion   PT Next Visit Plan review all exercises as needed.  answer any body mechanics questions, check sleeping improved?  manual as needed.   PT Home Exercise Plan scap retraction/ shoulder extension/ upper trap stretch, tennis ball trigger point release, supine scapular stabilization.   Consulted and Agree with Plan of Care Patient      Patient will benefit from skilled therapeutic intervention in order to improve the following deficits and impairments:     Visit Diagnosis: Cervicalgia  Other muscle spasm  Muscle weakness (generalized)     Problem List Patient Active Problem List   Diagnosis Date Noted  . UNEQUAL LEG LENGTH 02/02/2010  . BUNIONS, BILATERAL 12/16/2009  . FOOT PAIN, LEFT 12/16/2009    Yidel Teuscher PTA 09/29/2016, 5:25 PM  [facility] [address] [place], [place], [address] Phone: [phone]   Fax:  [phone]  Name: Barbara Greene MRN: [id number] Date of Birth: Jun 13, 1955

## 2016-09-29 NOTE — Patient Instructions (Addendum)
Sleeping on Back  Place pillow under knees. A pillow with cervical support and a roll around waist are also helpful. Copyright  VHI. All rights reserved.  Sleeping on Side Place pillow between knees. Use cervical support under neck and a roll around waist as needed. Copyright  VHI. All rights reserved.   Sleeping on Stomach   If this is the only desirable sleeping position, place pillow under lower legs, and under stomach or chest as needed.  Posture - Sitting   Sit upright, head facing forward. Try using a roll to support lower back. Keep shoulders relaxed, and avoid rounded back. Keep hips level with knees. Avoid crossing legs for long periods. Stand to Sit / Sit to Stand   To sit: Bend knees to lower self onto front edge of chair, then scoot back on seat. To stand: Reverse sequence by placing one foot forward, and scoot to front of seat. Use rocking motion to stand up.   Work Height and Reach  Ideal work height is no more than 2 to 4 inches below elbow level when standing, and at elbow level when sitting. Reaching should be limited to arm's length, with elbows slightly bent.  Bending  Bend at hips and knees, not back. Keep feet shoulder-width apart.    Posture - Standing   Good posture is important. Avoid slouching and forward head thrust. Maintain curve in low back and align ears over shoul- ders, hips over ankles.  Alternating Positions   Alternate tasks and change positions frequently to reduce fatigue and muscle tension. Take rest breaks. Computer Work   Position work to face forward. Use proper work and seat height. Keep shoulders back and down, wrists straight, and elbows at right angles. Use chair that provides full back support. Add footrest and lumbar roll as needed.  Getting Into / Out of Car  Lower self onto seat, scoot back, then bring in one leg at a time. Reverse sequence to get out.  Dressing  Lie on back to pull socks or slacks over feet, or sit  and bend leg while keeping back straight.    Housework - Sink  Place one foot on ledge of cabinet under sink when standing at sink for prolonged periods.   Pushing / Pulling  Pushing is preferable to pulling. Keep back in proper alignment, and use leg muscles to do the work.  Deep Squat   Squat and lift with both arms held against upper trunk. Tighten stomach muscles without holding breath. Use smooth movements to avoid jerking.  Avoid Twisting   Avoid twisting or bending back. Pivot around using foot movements, and bend at knees if needed when reaching for articles.  Carrying Luggage   Distribute weight evenly on both sides. Use a cart whenever possible. Do not twist trunk. Move body as a unit.   Lifting Principles .Maintain proper posture and head alignment. .Slide object as close as possible before lifting. .Move obstacles out of the way. .Test before lifting; ask for help if too heavy. .Tighten stomach muscles without holding breath. .Use smooth movements; do not jerk. .Use legs to do the work, and pivot with feet. .Distribute the work load symmetrically and close to the center of trunk. .Push instead of pull whenever possible.   Ask For Help   Ask for help and delegate to others when possible. Coordinate your movements when lifting together, and maintain the low back curve.  Log Roll   Lying on back, bend left knee and place left   arm across chest. Roll all in one movement to the right. Reverse to roll to the left. Always move as one unit. Housework - Sweeping  Use long-handled equipment to avoid stooping.   Housework - Wiping  Position yourself as close as possible to reach work surface. Avoid straining your back.  Laundry - Unloading Wash   To unload small items at bottom of washer, lift leg opposite to arm being used to reach.  [place] close to area to be raked. Use arm movements to do the work. Keep back straight and avoid  twisting.     Cart  When reaching into cart with one arm, lift opposite leg to keep back straight.   Getting Into / Out of Bed  Lower self to lie down on one side by raising legs and lowering head at the same time. Use arms to assist moving without twisting. Bend both knees to roll onto back if desired. To sit up, start from lying on side, and use same move-ments in reverse. Housework - Vacuuming  Hold the vacuum with arm held at side. Step back and forth to move it, keeping head up. Avoid twisting.   Laundry - IT consultant so that bending and twisting can be avoided.   Laundry - Unloading Dryer  Squat down to reach into clothes dryer or use a reacher.  Gardening - Weeding / Probation officer or Kneel. Knee pads may be helpful.                     Over Head Pull: Narrow Grip       On back, knees bent, feet flat, band across thighs, elbows straight but relaxed. Pull hands apart (start). Keeping elbows straight, bring arms up and over head, hands toward floor. Keep pull steady on band. Hold momentarily. Return slowly, keeping pull steady, back to start. Repeat _10__ times. Band color __yellow____   Side Pull: Double Arm   On back, knees bent, feet flat. Arms perpendicular to body, shoulder level, elbows straight but relaxed. Pull arms out to sides, elbows straight. Resistance band comes across collarbones, hands toward floor. Hold momentarily. Slowly return to starting position. Repeat _10__ times. Band color _Yellow____   Sash   On back, knees bent, feet flat, left hand on left hip, right hand above left. Pull right arm DIAGONALLY (hip to shoulder) across chest. Bring right arm along head toward floor. Hold momentarily. Slowly return to starting position. Repeat __5_ times. Do with left arm. Band color _yellow_____   Shoulder Rotation: Double Arm   On back, knees bent, feet flat, elbows tucked at sides, bent 90, hands palms up.  Pull hands apart and down toward floor, keeping elbows near sides. Hold momentarily. Slowly return to starting position. Repeat _10__ times. Band color ___yellow___

## 2016-10-06 ENCOUNTER — Encounter: Payer: Self-pay | Admitting: Physical Therapy

## 2016-10-06 ENCOUNTER — Ambulatory Visit: Payer: 59 | Admitting: Physical Therapy

## 2016-10-06 DIAGNOSIS — M542 Cervicalgia: Secondary | ICD-10-CM

## 2016-10-06 DIAGNOSIS — M6281 Muscle weakness (generalized): Secondary | ICD-10-CM | POA: Diagnosis not present

## 2016-10-06 DIAGNOSIS — M62838 Other muscle spasm: Secondary | ICD-10-CM | POA: Diagnosis not present

## 2016-10-07 NOTE — Therapy (Signed)
[place], [place], [address] Phone: [phone]   Fax:  [phone]  Physical Therapy Treatment  Patient Details  Name: Barbara Greene MRN: [id number] Date of Birth: [birth year] Referring Provider: Dr Lavone Orn   Encounter Date: 10/06/2016      PT End of Session - 10/07/16 0952    Visit Number 3   Number of Visits 12   Date for PT Re-Evaluation 11/04/16   Authorization Type [facility]    PT Start Time 6720   PT Stop Time 1628   PT Time Calculation (min) 43 min   Activity Tolerance Patient tolerated treatment well   Behavior During Therapy [facility] for tasks assessed/performed      Past Medical History:  Diagnosis Date  . Medical history non-contributory     Past Surgical History:  Procedure Laterality Date  . CESAREAN SECTION    . COLONOSCOPY WITH PROPOFOL N/A 06/08/2016   Procedure: COLONOSCOPY WITH PROPOFOL;  Surgeon: Garlan Fair, MD;  Location: [facility];  Service: Endoscopy;  Laterality: N/A;    There were no vitals filed for this visit.      Subjective Assessment - 10/07/16 0947    Subjective Patient reports that she feels like at times she is better but then this morning she flet like her neck was tight again. She has adjusted her desk which has helped. she is doing all her exercises without asignificant increase in pain.    How long can you sit comfortably? deppends on her activity    How long can you stand comfortably? N/A    How long can you walk comfortably? N/A    Diagnostic tests N/A    Currently in Pain? Yes   Pain Score 2    Pain Location Shoulder   Pain Orientation Left   Pain Descriptors / Indicators Aching   Pain Type Chronic pain   Pain Radiating Towards left arm and into the thumb and fore finger    Pain Onset More than a month ago   Pain Frequency Constant   Aggravating Factors  use of the arm and using the computer    Pain Relieving Factors rest                           [facility] Adult PT Treatment/Exercise - 10/07/16 0001      Neck Exercises: Seated   Other Seated Exercise setaed cervical retraction 2x10      Shoulder Exercises: Supine   Other Supine Exercises Supine scapular stabilization exercises , HEP, 2x10 with cues:  ER, Sash, both, narrow grip fleXion and ahorizontal abduction.  No pain but continues to have crepitus in the shoulder. No pain. Advanced to red band.       Manual Therapy   Manual therapy comments manual traction; trigger point release to cervical spine and upper trap.      Neck Exercises: Stretches   Upper Trapezius Stretch Limitations 2x20sec hold                 PT Education - 10/07/16 0949    Education provided Yes   Education Details ADL's posture, HEP    Person(s) Educated Patient   Methods Explanation;Demonstration;Tactile cues;Verbal cues   Comprehension Verbalized understanding;Returned demonstration;Verbal cues required          PT Short Term Goals - 10/07/16 0957      PT SHORT TERM GOAL #1   Title Patient will  increase cervical rotation to the left by 10 degrees    Time 4   Period Weeks   Status On-going     PT SHORT TERM GOAL #2   Title Patient will demsotrate 5/5 gross right shoulder strength    Time 4   Period Weeks   Status On-going     PT SHORT TERM GOAL #3   Title Patient will demsotrate no significant tenderness to aplpation in her right upper trap   Time 4   Period Weeks   Status On-going     PT SHORT TERM GOAL #4   Title Patient will be independent with initial HEP    Time 4   Period Weeks   Status On-going           PT Long Term Goals - 09/23/16 1312      PT LONG TERM GOAL #1   Title Patient will demonstrate 5/5 scapualr strength in order to 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    Time 8   Period Weeks   Status New     PT LONG TERM GOAL #2   Title Patient will sit at her job for 4-5 hours without report of increased pain     Time 8   Period Weeks   Status New     PT LONG TERM GOAL #3   Title Patient will return to the gym with program to promote pusture    Time 8   Period Weeks   Status New     PT LONG TERM GOAL #4   Title Patient will set up her work station to promote proper posture while sitting    Time 8   Period Weeks   Status New               Plan - 10/07/16 0954    Clinical Impression Statement Patient continues oto have spasmingof her left upper trap. Therapy reviewed the benefits for dry needling but she declined for now. Therapy progressed the patient to a red band. Patient's grip strength was measured at 60 lbs on the left and right. Herr strength appears to be progressing.    Clinical Presentation Stable   Clinical Decision Making Low   Rehab Potential Good   PT Frequency 2x / week   PT Duration 8 weeks   PT Treatment/Interventions ADLs/Self Care Home Management;Cryotherapy;Electrical Stimulation;Iontophoresis 4mg /ml Dexamethasone;Gait training;Moist Heat;Ultrasound;Therapeutic activities;Therapeutic exercise;Patient/family education;Manual techniques;Taping;Stair training;Neuromuscular re-education;Passive range of motion   PT Next Visit Plan review all exercises as needed.  answer any body mechanics questions, check sleeping improved?  manual as needed.   PT Home Exercise Plan scap retraction/ shoulder extension/ upper trap stretch, tennis ball trigger point release, supine scapular stabilization.   Consulted and Agree with Plan of Care Patient      Patient will benefit from skilled therapeutic intervention in order to improve the following deficits and impairments:  Decreased strength, Postural dysfunction, Decreased activity tolerance, Pain, Impaired UE functional use, Increased muscle spasms  Visit Diagnosis: Cervicalgia  Other muscle spasm  Muscle weakness (generalized)     Problem List Patient Active Problem List   Diagnosis Date Noted  . UNEQUAL LEG LENGTH  02/02/2010  . BUNIONS, BILATERAL 12/16/2009  . FOOT PAIN, LEFT 12/16/2009    [name] PT DPT  10/07/2016, 10:01 AM  [facility] [address] [place], [place], [address] Phone: [phone]   Fax:  [phone]  Name: Barbara Greene MRN: [id number] Date  of Birth: 1956-03-02

## 2016-10-12 DIAGNOSIS — Z6821 Body mass index (BMI) 21.0-21.9, adult: Secondary | ICD-10-CM | POA: Diagnosis not present

## 2016-10-12 DIAGNOSIS — Z1231 Encounter for screening mammogram for malignant neoplasm of breast: Secondary | ICD-10-CM | POA: Diagnosis not present

## 2016-10-12 DIAGNOSIS — Z01419 Encounter for gynecological examination (general) (routine) without abnormal findings: Secondary | ICD-10-CM | POA: Diagnosis not present

## 2016-10-14 ENCOUNTER — Ambulatory Visit: Payer: 59 | Admitting: Physical Therapy

## 2016-10-14 DIAGNOSIS — M542 Cervicalgia: Secondary | ICD-10-CM | POA: Diagnosis not present

## 2016-10-14 DIAGNOSIS — M6281 Muscle weakness (generalized): Secondary | ICD-10-CM | POA: Diagnosis not present

## 2016-10-14 DIAGNOSIS — M62838 Other muscle spasm: Secondary | ICD-10-CM | POA: Diagnosis not present

## 2016-10-15 ENCOUNTER — Encounter: Payer: Self-pay | Admitting: Physical Therapy

## 2016-10-15 NOTE — Therapy (Signed)
[place], [place], [address] Phone: [phone]   Fax:  [phone]  Physical Therapy Treatment  Patient Details  Name: Barbara Greene MRN: [id number] Date of Birth: [birth year] Referring Provider: Dr Lavone Orn   Encounter Date: 10/14/2016      PT End of Session - 10/15/16 0749    Visit Number 4   Number of Visits 12   Date for PT Re-Evaluation 11/04/16   Authorization Type [facility]    PT Start Time 0810   PT Stop Time 0848   PT Time Calculation (min) 38 min   Activity Tolerance Patient tolerated treatment well   Behavior During Therapy [facility] for tasks assessed/performed      Past Medical History:  Diagnosis Date  . Medical history non-contributory     Past Surgical History:  Procedure Laterality Date  . CESAREAN SECTION    . COLONOSCOPY WITH PROPOFOL N/A 06/08/2016   Procedure: COLONOSCOPY WITH PROPOFOL;  Surgeon: Garlan Fair, MD;  Location: [facility];  Service: Endoscopy;  Laterality: N/A;    There were no vitals filed for this visit.      Subjective Assessment - 10/15/16 0746    Subjective Patient reports that over the weekend she lifted things and at work she lifted boxes. She feels like she is very sore today. She was having clicking in her shoulder. with overhead flexion. She was advised to not perfrom if she has consistent clicking.    How long can you sit comfortably? deppends on her activity    How long can you stand comfortably? N/A    How long can you walk comfortably? N/A    Diagnostic tests N/A    Currently in Pain? Yes   Pain Score 8    Pain Location Shoulder   Pain Orientation Left   Pain Descriptors / Indicators Aching   Pain Type Chronic pain   Pain Radiating Towards into the left upper trap    Pain Onset More than a month ago   Pain Frequency Constant   Aggravating Factors  use of the arm    Pain Relieving Factors rest    Multiple Pain Sites No                          [facility] Adult PT Treatment/Exercise - 10/15/16 0001      Manual Therapy   Manual therapy comments manual traction; trigger point release to cervical spine and upper trap. IASTYM to upper trap and cervical paraspinals; C6 C& opening mobilization.                 PT Education - 10/15/16 0749    Education provided Yes   Education Details ADL posture, HEP    Person(s) Educated Patient   Methods Explanation;Demonstration;Tactile cues;Verbal cues   Comprehension Verbalized understanding;Returned demonstration;Verbal cues required          PT Short Term Goals - 10/07/16 0957      PT SHORT TERM GOAL #1   Title Patient will increase cervical rotation to the left by 10 degrees    Time 4   Period Weeks   Status On-going     PT SHORT TERM GOAL #2   Title Patient will demsotrate 5/5 gross right shoulder strength    Time 4   Period Weeks   Status On-going     PT SHORT TERM GOAL #3   Title Patient will demsotrate no significant  tenderness to aplpation in her right upper trap   Time 4   Period Weeks   Status On-going     PT SHORT TERM GOAL #4   Title Patient will be independent with initial HEP    Time 4   Period Weeks   Status On-going           PT Long Term Goals - 09/23/16 1312      PT LONG TERM GOAL #1   Title Patient will demonstrate 5/5 scapualr strength in order to 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    Time 8   Period Weeks   Status New     PT LONG TERM GOAL #2   Title Patient will sit at her job for 4-5 hours without report of increased pain    Time 8   Period Weeks   Status New     PT LONG TERM GOAL #3   Title Patient will return to the gym with program to promote pusture    Time 8   Period Weeks   Status New     PT LONG TERM GOAL #4   Title Patient will set up her work station to promote proper posture while sitting    Time 8   Period Weeks   Status New               Plan - 10/15/16  0754    Clinical Impression Statement Therapy focused on manual therapy. She had decreased pain and increased motion with treatment. She would like to try dry needling but dies not want to try it when she has to work all day. She will make an appointment next week.    Clinical Presentation Stable   Rehab Potential Good   PT Frequency 2x / week   PT Duration 8 weeks   PT Treatment/Interventions ADLs/Self Care Home Management;Cryotherapy;Electrical Stimulation;Iontophoresis 4mg /ml Dexamethasone;Gait training;Moist Heat;Ultrasound;Therapeutic activities;Therapeutic exercise;Patient/family education;Manual techniques;Taping;Stair training;Neuromuscular re-education;Passive range of motion   PT Next Visit Plan review all exercises as needed.  answer any body mechanics questions, check sleeping improved?  manual as needed.   PT Home Exercise Plan scap retraction/ shoulder extension/ upper trap stretch, tennis ball trigger point release, supine scapular stabilization.      Patient will benefit from skilled therapeutic intervention in order to improve the following deficits and impairments:  Decreased strength, Postural dysfunction, Decreased activity tolerance, Pain, Impaired UE functional use, Increased muscle spasms  Visit Diagnosis: Cervicalgia  Other muscle spasm  Muscle weakness (generalized)     Problem List Patient Active Problem List   Diagnosis Date Noted  . UNEQUAL LEG LENGTH 02/02/2010  . BUNIONS, BILATERAL 12/16/2009  . FOOT PAIN, LEFT 12/16/2009    [name] PT 44  10/15/2016, 8:02 AM  [facility] [address] [place], [place], [address] Phone: [phone]   Fax:  [phone]  Name: Barbara Greene MRN: [id number] Date of Birth: 12-Jun-1955

## 2016-10-19 ENCOUNTER — Ambulatory Visit: Payer: 59 | Admitting: Physical Therapy

## 2016-10-19 ENCOUNTER — Encounter: Payer: Self-pay | Admitting: Physical Therapy

## 2016-10-19 DIAGNOSIS — M6281 Muscle weakness (generalized): Secondary | ICD-10-CM

## 2016-10-19 DIAGNOSIS — M542 Cervicalgia: Secondary | ICD-10-CM

## 2016-10-19 DIAGNOSIS — M62838 Other muscle spasm: Secondary | ICD-10-CM

## 2016-10-20 ENCOUNTER — Encounter: Payer: Self-pay | Admitting: Physical Therapy

## 2016-10-20 NOTE — Therapy (Signed)
[place], [place], [address] Phone: [phone]   Fax:  [phone]  Physical Therapy Treatment  Patient Details  Name: Barbara Greene MRN: [id number] Date of Birth: [birth year] Referring Provider: Dr Lavone Orn   Encounter Date: 10/19/2016      PT End of Session - 10/20/16 1320    Visit Number 5   Number of Visits 12   Date for PT Re-Evaluation 11/04/16   Authorization Type [facility]    PT Start Time 0806   PT Stop Time 0844   PT Time Calculation (min) 38 min   Activity Tolerance Patient tolerated treatment well   Behavior During Therapy [facility] for tasks assessed/performed      Past Medical History:  Diagnosis Date  . Medical history non-contributory     Past Surgical History:  Procedure Laterality Date  . CESAREAN SECTION    . COLONOSCOPY WITH PROPOFOL N/A 06/08/2016   Procedure: COLONOSCOPY WITH PROPOFOL;  Surgeon: Garlan Fair, MD;  Location: [facility];  Service: Endoscopy;  Laterality: N/A;    There were no vitals filed for this visit.      Subjective Assessment - 10/19/16 0810    Subjective Patient reports the pain has improved but she is still having tightness in her right upper trap. She is having less tingling into her forearm.    How long can you sit comfortably? deppends on her activity    How long can you stand comfortably? N/A    How long can you walk comfortably? N/A    Diagnostic tests N/A    Currently in Pain? Yes   Pain Score 4    Pain Location Shoulder   Pain Orientation Left   Pain Descriptors / Indicators Aching   Pain Type Chronic pain   Pain Radiating Towards into the upper trap    Pain Onset More than a month ago   Pain Frequency Constant   Aggravating Factors  use of the arm    Pain Relieving Factors rest    Multiple Pain Sites No                         [facility] Adult PT Treatment/Exercise - 10/20/16 0001      Moist Heat Therapy   Number Minutes Moist Heat 15 Minutes   Moist Heat Location Cervical     Manual Therapy   Manual therapy comments manual traction; trigger point release to cervical spine and upper trap. IASTYM to upper trap and cervical paraspinals; C6 C7 opening mobilization.                 PT Education - 10/19/16 [phone]    Education provided Yes   Education Details ADL; posture    Person(s) Educated Patient   Methods Explanation;Demonstration;Tactile cues;Verbal cues   Comprehension Verbalized understanding;Returned demonstration;Verbal cues required          PT Short Term Goals - 10/20/16 1324      PT SHORT TERM GOAL #1   Title Patient will increase cervical rotation to the left by 10 degrees    Time 4   Period Weeks   Status On-going     PT SHORT TERM GOAL #2   Title Patient will demsotrate 5/5 gross right shoulder strength    Time 4   Period Weeks   Status On-going     PT SHORT TERM GOAL #3   Title Patient will demsotrate no significant tenderness  to aplpation in her right upper trap   Time 4   Period Weeks   Status On-going     PT SHORT TERM GOAL #4   Title Patient will be independent with initial HEP    Time 4   Period Weeks   Status On-going           PT Long Term Goals - 09/23/16 1312      PT LONG TERM GOAL #1   Title Patient will demonstrate 5/5 scapualr strength in order to 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    Time 8   Period Weeks   Status New     PT LONG TERM GOAL #2   Title Patient will sit at her job for 4-5 hours without report of increased pain    Time 8   Period Weeks   Status New     PT LONG TERM GOAL #3   Title Patient will return to the gym with program to promote pusture    Time 8   Period Weeks   Status New     PT LONG TERM GOAL #4   Title Patient will set up her work station to promote proper posture while sitting    Time 8   Period Weeks   Status New               Plan - 10/20/16 1321    Clinical  Impression Statement Patient tolerated treatment well. Therapy continued to work on soft tissue mobilizations and cervical mobilizations on the left. She reported tension after the treatment. She will likely come in for trigger point dry needling next visit. Patient will perfrom her exercises at home    Clinical Presentation Stable   Clinical Decision Making Low   Rehab Potential Good   PT Frequency 2x / week   PT Duration 8 weeks   PT Treatment/Interventions ADLs/Self Care Home Management;Cryotherapy;Electrical Stimulation;Iontophoresis 4mg /ml Dexamethasone;Gait training;Moist Heat;Ultrasound;Therapeutic activities;Therapeutic exercise;Patient/family education;Manual techniques;Taping;Stair training;Neuromuscular re-education;Passive range of motion   PT Next Visit Plan review all exercises as needed.  answer any body mechanics questions, check sleeping improved?  manual as needed.   PT Home Exercise Plan scap retraction/ shoulder extension/ upper trap stretch, tennis ball trigger point release, supine scapular stabilization.   Consulted and Agree with Plan of Care Patient      Patient will benefit from skilled therapeutic intervention in order to improve the following deficits and impairments:  Decreased strength, Postural dysfunction, Decreased activity tolerance, Pain, Impaired UE functional use, Increased muscle spasms  Visit Diagnosis: Cervicalgia  Other muscle spasm  Muscle weakness (generalized)     Problem List Patient Active Problem List   Diagnosis Date Noted  . UNEQUAL LEG LENGTH 02/02/2010  . BUNIONS, BILATERAL 12/16/2009  . FOOT PAIN, LEFT 12/16/2009    [name] PT DPT  10/20/2016, 1:26 PM  [facility] [address] [place], [place], [address] Phone: [phone]   Fax:  [phone]  Name: Barbara Greene MRN: [id number] Date of Birth: [birth year]

## 2016-10-25 ENCOUNTER — Ambulatory Visit: Payer: 59 | Attending: Internal Medicine | Admitting: Physical Therapy

## 2016-10-25 DIAGNOSIS — M542 Cervicalgia: Secondary | ICD-10-CM | POA: Diagnosis not present

## 2016-10-25 DIAGNOSIS — M62838 Other muscle spasm: Secondary | ICD-10-CM | POA: Diagnosis not present

## 2016-10-25 DIAGNOSIS — M6281 Muscle weakness (generalized): Secondary | ICD-10-CM | POA: Insufficient documentation

## 2016-10-26 ENCOUNTER — Encounter: Payer: Self-pay | Admitting: Physical Therapy

## 2016-10-26 NOTE — Therapy (Signed)
[place], [place], [address] Phone: [phone]   Fax:  [phone]  Physical Therapy Treatment  Patient Details  Name: Barbara Greene MRN: [id number] Date of Birth: 02/23/1956 Referring Provider: Dr Lavone Orn   Encounter Date: 10/25/2016      PT End of Session - 10/26/16 0820    Visit Number 6   Number of Visits 12   Date for PT Re-Evaluation 11/04/16   Authorization Type [facility]    PT Start Time 0300   PT Stop Time 0355   PT Time Calculation (min) 55 min   Activity Tolerance Patient tolerated treatment well   Behavior During Therapy [facility] for tasks assessed/performed      Past Medical History:  Diagnosis Date  . Medical history non-contributory     Past Surgical History:  Procedure Laterality Date  . CESAREAN SECTION    . COLONOSCOPY WITH PROPOFOL N/A 06/08/2016   Procedure: COLONOSCOPY WITH PROPOFOL;  Surgeon: Garlan Fair, MD;  Location: [facility];  Service: Endoscopy;  Laterality: N/A;    There were no vitals filed for this visit.      Subjective Assessment - 10/26/16 0809    Subjective Patient continues to report some pain and tension. She is consdiering trying dry needling. She has the feeling back into her elbow. She was able to use her stretches to stretch out her neck.    How long can you sit comfortably? deppends on her activity    How long can you stand comfortably? N/A    How long can you walk comfortably? N/A    Diagnostic tests N/A    Currently in Pain? Yes   Pain Score 4    Pain Location Shoulder   Pain Orientation Left   Pain Descriptors / Indicators Aching   Pain Type Chronic pain   Pain Radiating Towards into the upper trap    Pain Onset More than a month ago   Pain Frequency Constant   Aggravating Factors  use of the arm    Pain Relieving Factors rest    Multiple Pain Sites No                         [facility] Adult PT Treatment/Exercise  - 10/26/16 0001      Neck Exercises: Standing   Other Standing Exercises standing shoulder extension red 2x10; Standing shoulder retraction 2x10      Moist Heat Therapy   Number Minutes Moist Heat 10 Minutes   Moist Heat Location Cervical     Manual Therapy   Manual therapy comments manual traction; trigger point release to cervical spine and upper trap. IASTYM to upper trap and cervical paraspinals; C6 C7 opening mobilization.           Trigger Point Dry Needling - 10/26/16 1062    Consent Given? Yes   Education Handout Provided Yes   Muscles Treated Upper Body Upper trapezius  C6 cervical multifidis    Upper Trapezius Response Twitch reponse elicited  2 spots needled               PT Education - 10/26/16 0813    Education Details benefits andrisk of trigger point dry needling. Percautions with TDDN   Person(s) Educated Patient   Methods Explanation;Demonstration;Tactile cues;Verbal cues          PT Short Term Goals - 10/26/16 0832      PT SHORT TERM GOAL #1  Title Patient will increase cervical rotation to the left by 10 degrees    Time 4   Period Weeks   Status On-going     PT SHORT TERM GOAL #2   Title Patient will demsotrate 5/5 gross right shoulder strength    Time 4   Period Weeks   Status On-going     PT SHORT TERM GOAL #3   Title Patient will demsotrate no significant tenderness to aplpation in her right upper trap   Time 4   Period Weeks   Status On-going     PT SHORT TERM GOAL #4   Title Patient will be independent with initial HEP    Time 4   Period Weeks   Status On-going           PT Long Term Goals - 09/23/16 1312      PT LONG TERM GOAL #1   Title Patient will demonstrate 5/5 scapualr strength in order to 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    Time 8   Period Weeks   Status New     PT LONG TERM GOAL #2   Title Patient will sit at her job for 4-5 hours without report of increased pain    Time 8   Period Weeks    Status New     PT LONG TERM GOAL #3   Title Patient will return to the gym with program to promote pusture    Time 8   Period Weeks   Status New     PT LONG TERM GOAL #4   Title Patient will set up her work station to promote proper posture while sitting    Time 8   Period Weeks   Status New               Plan - 10/26/16 0825    Clinical Impression Statement Patient tolerated treatment well. She reported minor tightness after the TPDN but overall she did well. She was advised to continue with HEP and continue with soft tissue work at home.    Clinical Presentation Stable   Clinical Decision Making Low   Rehab Potential Good   PT Frequency 2x / week   PT Duration 8 weeks   PT Treatment/Interventions ADLs/Self Care Home Management;Cryotherapy;Electrical Stimulation;Iontophoresis 4mg /ml Dexamethasone;Gait training;Moist Heat;Ultrasound;Therapeutic activities;Therapeutic exercise;Patient/family education;Manual techniques;Taping;Stair training;Neuromuscular re-education;Passive range of motion   PT Next Visit Plan review all exercises as needed.  answer any body mechanics questions, check sleeping improved?  manual as needed.   PT Home Exercise Plan scap retraction/ shoulder extension/ upper trap stretch, tennis ball trigger point release, supine scapular stabilization.   Consulted and Agree with Plan of Care Patient      Patient will benefit from skilled therapeutic intervention in order to improve the following deficits and impairments:  Decreased strength, Postural dysfunction, Decreased activity tolerance, Pain, Impaired UE functional use, Increased muscle spasms  Visit Diagnosis: Cervicalgia  Other muscle spasm  Muscle weakness (generalized)     Problem List Patient Active Problem List   Diagnosis Date Noted  . UNEQUAL LEG LENGTH 02/02/2010  . BUNIONS, BILATERAL 12/16/2009  . FOOT PAIN, LEFT 12/16/2009    [name]  PT DPT  10/26/2016, 8:34 AM  [facility] [address] [place], [place], [address] Phone: [phone]   Fax:  [phone]  Name: Barbara Greene MRN: [id number] Date of Birth: Sep 05, 1955

## 2016-11-01 ENCOUNTER — Ambulatory Visit: Payer: 59 | Admitting: Physical Therapy

## 2016-11-01 DIAGNOSIS — M542 Cervicalgia: Secondary | ICD-10-CM | POA: Diagnosis not present

## 2016-11-01 DIAGNOSIS — M62838 Other muscle spasm: Secondary | ICD-10-CM

## 2016-11-01 DIAGNOSIS — M6281 Muscle weakness (generalized): Secondary | ICD-10-CM | POA: Diagnosis not present

## 2016-11-02 NOTE — Therapy (Signed)
[place], [place], [address] Phone: [phone]   Fax:  [phone]  Physical Therapy Treatment  Patient Details  Name: Barbara Greene MRN: [id number] Date of Birth: Jan 06, 1956 Referring Provider: Dr Lavone Orn   Encounter Date: 11/01/2016      PT End of Session - 11/02/16 1128    Visit Number 7   Number of Visits 12   Date for PT Re-Evaluation 11/04/16   Authorization Type [facility]    PT Start Time 0092   PT Stop Time 1643   PT Time Calculation (min) 54 min   Activity Tolerance Patient tolerated treatment well   Behavior During Therapy [facility] for tasks assessed/performed      Past Medical History:  Diagnosis Date  . Medical history non-contributory     Past Surgical History:  Procedure Laterality Date  . CESAREAN SECTION    . COLONOSCOPY WITH PROPOFOL N/A 06/08/2016   Procedure: COLONOSCOPY WITH PROPOFOL;  Surgeon: Garlan Fair, MD;  Location: [facility];  Service: Endoscopy;  Laterality: N/A;    There were no vitals filed for this visit.      Subjective Assessment - 11/02/16 1126    Subjective Patient reports her pain has improved since last visit. She is not having any sensations into her arm. She does feeltightness in her scalenes at times. She has been doing her stretches and exercises.    How long can you sit comfortably? deppends on her activity    How long can you stand comfortably? N/A    How long can you walk comfortably? N/A    Diagnostic tests N/A    Currently in Pain? Yes   Pain Score 4    Pain Location Shoulder   Pain Orientation Left   Pain Descriptors / Indicators Aching   Pain Type Chronic pain   Pain Onset More than a month ago   Pain Frequency Constant   Aggravating Factors  use of the arm    Pain Relieving Factors rest    Multiple Pain Sites No                         [facility] Adult PT Treatment/Exercise - 11/02/16 0001      Neck Exercises:  Standing   Other Standing Exercises standing shoulder flexion 1lb 2x10; standing scaption 2x10 1lb;      Moist Heat Therapy   Number Minutes Moist Heat 10 Minutes   Moist Heat Location Cervical     Manual Therapy   Manual therapy comments manual traction; trigger point release to cervical spine and upper trap. IASTYM to upper trap and cervical paraspinals; C6 C7 opening mobilization.           Trigger Point Dry Needling - 11/02/16 1125    Consent Given? Yes   Education Handout Provided Yes   Muscles Treated Upper Body Upper trapezius   Upper Trapezius Response Twitch reponse elicited              PT Education - 11/02/16 1128    Education provided Yes   Education Details side effects and benefits of TPDN    Person(s) Educated Patient   Methods Explanation;Demonstration;Tactile cues;Verbal cues   Comprehension Verbalized understanding;Returned demonstration;Verbal cues required          PT Short Term Goals - 10/26/16 3300      PT SHORT TERM GOAL #1   Title Patient will increase cervical rotation to the  left by 10 degrees    Time 4   Period Weeks   Status On-going     PT SHORT TERM GOAL #2   Title Patient will demsotrate 5/5 gross right shoulder strength    Time 4   Period Weeks   Status On-going     PT SHORT TERM GOAL #3   Title Patient will demsotrate no significant tenderness to aplpation in her right upper trap   Time 4   Period Weeks   Status On-going     PT SHORT TERM GOAL #4   Title Patient will be independent with initial HEP    Time 4   Period Weeks   Status On-going           PT Long Term Goals - 11/02/16 1133      PT LONG TERM GOAL #1   Title Patient will demonstrate 5/5 scapualr strength in order to 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    Baseline will measure next visit    Time 8   Period Weeks   Status On-going     PT LONG TERM GOAL #2   Title Patient will sit at her job for 4-5 hours without report of increased pain     Baseline improved ability to perfrom work tasks but still having pain    Time 8   Period Weeks   Status On-going     PT LONG TERM GOAL #3   Title Patient will return to the gym with program to promote pusture    Baseline has not gone back to the gym yet    Time 8   Period Weeks   Status On-going     PT LONG TERM GOAL #4   Title Patient will set up her work station to promote proper posture while sitting    Time 8   Period Weeks   Status On-going               Plan - 11/02/16 1130    Clinical Impression Statement Patient is making progress. Therapy achieved a great tiwthc response in her upper trap. She has had tightness in her scalenes but no trigger points noted in the scalenes today. Therapy will perform DN to scalenes if they are tight next time. She is making good progress overall. Therapy added standing flexion for gfuntioanl exercises. She had some popping with forwardflexion. She likley has some shoulder involvement as well. she was encouraged to contoinue with exercises as they would help her shoulder too.    Clinical Presentation Stable   Clinical Decision Making Low   Rehab Potential Good   PT Frequency 2x / week   PT Duration 8 weeks   PT Treatment/Interventions ADLs/Self Care Home Management;Cryotherapy;Electrical Stimulation;Iontophoresis 4mg /ml Dexamethasone;Gait training;Moist Heat;Ultrasound;Therapeutic activities;Therapeutic exercise;Patient/family education;Manual techniques;Taping;Stair training;Neuromuscular re-education;Passive range of motion   PT Next Visit Plan review all exercises as needed.  answer any body mechanics questions, check sleeping improved?  manual as needed.   PT Home Exercise Plan scap retraction/ shoulder extension/ upper trap stretch, tennis ball trigger point release, supine scapular stabilization.   Consulted and Agree with Plan of Care Patient      Patient will benefit from skilled therapeutic intervention in order to improve  the following deficits and impairments:  Decreased strength, Postural dysfunction, Decreased activity tolerance, Pain, Impaired UE functional use, Increased muscle spasms  Visit Diagnosis: Cervicalgia  Other muscle spasm  Muscle weakness (generalized)     Problem List Patient Active Problem  List   Diagnosis Date Noted  . UNEQUAL LEG LENGTH 02/02/2010  . BUNIONS, BILATERAL 12/16/2009  . FOOT PAIN, LEFT 12/16/2009    [name]  PT DPT  11/02/2016, 11:36 AM  [facility] [address] [place], [place], [address] Phone: [phone]   Fax:  [phone]  Name: Barbara Greene MRN: [id number] Date of Birth: 1955/12/28

## 2016-11-05 MED FILL — ATORVASTATIN 40 MG TABLET: 40 | 90 days supply | Qty: 90 | Fill #3

## 2016-11-08 ENCOUNTER — Ambulatory Visit: Payer: 59 | Admitting: Physical Therapy

## 2016-11-08 DIAGNOSIS — M62838 Other muscle spasm: Secondary | ICD-10-CM

## 2016-11-08 DIAGNOSIS — M542 Cervicalgia: Secondary | ICD-10-CM

## 2016-11-08 DIAGNOSIS — M6281 Muscle weakness (generalized): Secondary | ICD-10-CM | POA: Diagnosis not present

## 2016-11-09 ENCOUNTER — Encounter: Payer: Self-pay | Admitting: Physical Therapy

## 2016-11-09 NOTE — Therapy (Addendum)
[place], [place], [address] Phone: [phone]   Fax:  [phone]  Physical Therapy Treatment  Patient Details  Name: Barbara Greene MRN: [id number] Date of Birth: 1956/01/06 Referring Provider: Dr Lavone Orn   Encounter Date: 11/08/2016      PT End of Session - 11/09/16 1257    Visit Number 8   Number of Visits 12   Date for PT Re-Evaluation 12/21/16   Authorization Type [facility]    PT Start Time 0350   PT Stop Time 0430   PT Time Calculation (min) 40 min   Activity Tolerance Patient tolerated treatment well   Behavior During Therapy [facility] for tasks assessed/performed      Past Medical History:  Diagnosis Date  . Medical history non-contributory     Past Surgical History:  Procedure Laterality Date  . CESAREAN SECTION    . COLONOSCOPY WITH PROPOFOL N/A 06/08/2016   Procedure: COLONOSCOPY WITH PROPOFOL;  Surgeon: Garlan Fair, MD;  Location: [facility];  Service: Endoscopy;  Laterality: N/A;    There were no vitals filed for this visit.      Subjective Assessment - 11/09/16 1307    Subjective Patient has not had pain for several days now. She feels like she is doing much better. She will come for 1-2 more visits for a gym program.    How long can you sit comfortably? deppends on her activity    How long can you stand comfortably? N/A    How long can you walk comfortably? N/A    Diagnostic tests N/A    Currently in Pain? No/denies            [facility] PT Assessment - 11/09/16 0001      AROM   Cervical Flexion pain at end range    Cervical Extension pain at end range    Cervical - Right Rotation 75   Cervical - Left Rotation 77     Strength   Overall Strength Comments 60 lb/ 60 lb    Right Shoulder Flexion 5/5   Right Shoulder ABduction 5/5   Right Shoulder External Rotation 5/5                     [facility] Adult PT Treatment/Exercise - 11/09/16 0001      Neck Exercises: Seated   Other Seated Exercise Scalene stretch 2x20sec hold; levator stretch 3x15 sec hold. Mod cuing for technique with each.      Moist Heat Therapy   Number Minutes Moist Heat 10 Minutes   Moist Heat Location Cervical     Manual Therapy   Manual therapy comments manual traction; trigger point release to cervical spine and upper trap. IASTYM to upper trap and cervical paraspinals; C6 C7 opening mobilization.                 PT Education - 11/09/16 1257    Education provided Yes   Education Details importance and technique with stretching    Person(s) Educated Patient   Methods Explanation;Demonstration;Tactile cues;Verbal cues   Comprehension Verbalized understanding;Returned demonstration;Verbal cues required          PT Short Term Goals - 10/26/16 6962      PT SHORT TERM GOAL #1   Title Patient will increase cervical rotation to the left by 10 degrees    Time 4   Period Weeks   Status On-going     PT SHORT TERM GOAL #  2   Title Patient will demsotrate 5/5 gross right shoulder strength    Time 4   Period Weeks   Status On-going     PT SHORT TERM GOAL #3   Title Patient will demsotrate no significant tenderness to aplpation in her right upper trap   Time 4   Period Weeks   Status On-going     PT SHORT TERM GOAL #4   Title Patient will be independent with initial HEP    Time 4   Period Weeks   Status On-going           PT Long Term Goals - 11/02/16 1133      PT LONG TERM GOAL #1   Title Patient will demonstrate 5/5 scapualr strength in order to 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    Baseline will measure next visit    Time 8   Period Weeks   Status On-going     PT LONG TERM GOAL #2   Title Patient will sit at her job for 4-5 hours without report of increased pain    Baseline improved ability to perfrom work tasks but still having pain    Time 8   Period Weeks   Status On-going     PT LONG TERM GOAL #3   Title  Patient will return to the gym with program to promote pusture    Baseline has not gone back to the gym yet    Time 8   Period Weeks   Status On-going     PT LONG TERM GOAL #4   Title Patient will set up her work station to promote proper posture while sitting    Time 8   Period Weeks   Status On-going               Plan - 11/09/16 1259    Clinical Impression Statement Patient is making good progress. She continues to have tightness in the scalenes at times but she was given stretches. Therapy continues to focus on manual therapy. Her trigger points were much smaller today so she declined dry needling. Patient will be seen for 1-2 more visit reviewed basic gym program.    Clinical Presentation Stable   Clinical Decision Making Low   Rehab Potential Good   PT Frequency 2x / week   PT Duration 8 weeks   PT Treatment/Interventions ADLs/Self Care Home Management;Cryotherapy;Electrical Stimulation;Iontophoresis 79m/ml Dexamethasone;Gait training;Moist Heat;Ultrasound;Therapeutic activities;Therapeutic exercise;Patient/family education;Manual techniques;Taping;Stair training;Neuromuscular re-education;Passive range of motion   PT Next Visit Plan review all exercises as needed.  answer any body mechanics questions, check sleeping improved?  manual as needed. Gym program    PT Home Exercise Plan scap retraction/ shoulder extension/ upper trap stretch, tennis ball trigger point release, supine scapular stabilization.   Consulted and Agree with Plan of Care Patient      Patient will benefit from skilled therapeutic intervention in order to improve the following deficits and impairments:  Decreased strength, Postural dysfunction, Decreased activity tolerance, Pain, Impaired UE functional use, Increased muscle spasms, Decreased range of motion  Visit Diagnosis: Cervicalgia - Plan: PT plan of care cert/re-cert  Other muscle spasm - Plan: PT plan of care cert/re-cert  Muscle weakness  (generalized) - Plan: PT plan of care cert/re-cert  PHYSICAL THERAPY DISCHARGE SUMMARY  Visits from Start of Care: 8  Current functional level related to goals / functional outcomes: Improved pain and arm strength    Remaining deficits: Pain at times  Education / Equipment: HEP  Plan: Patient agrees to discharge.  Patient goals were not met. Patient is being discharged due to not returning since the last visit.  ?????       Problem List Patient Active Problem List   Diagnosis Date Noted  . UNEQUAL LEG LENGTH 02/02/2010  . BUNIONS, BILATERAL 12/16/2009  . FOOT PAIN, LEFT 12/16/2009    [name] 11/09/2016, 1:11 PM  [facility] [address] [place], [place], [address] Phone: [phone]   Fax:  [phone]  Name: MARNA WENIGER MRN: [id number] Date of Birth: [birth year]

## 2016-11-10 DIAGNOSIS — Z6820 Body mass index (BMI) 20.0-20.9, adult: Secondary | ICD-10-CM | POA: Diagnosis not present

## 2016-11-10 DIAGNOSIS — R634 Abnormal weight loss: Secondary | ICD-10-CM | POA: Diagnosis not present

## 2016-11-10 DIAGNOSIS — K1379 Other lesions of oral mucosa: Secondary | ICD-10-CM | POA: Diagnosis not present

## 2016-11-15 ENCOUNTER — Encounter: Payer: 59 | Admitting: Physical Therapy

## 2016-11-24 ENCOUNTER — Ambulatory Visit: Payer: 59 | Admitting: Physical Therapy

## 2016-12-02 DIAGNOSIS — F339 Major depressive disorder, recurrent, unspecified: Secondary | ICD-10-CM | POA: Diagnosis not present

## 2016-12-02 DIAGNOSIS — K1370 Unspecified lesions of oral mucosa: Secondary | ICD-10-CM | POA: Diagnosis not present

## 2016-12-02 DIAGNOSIS — R030 Elevated blood-pressure reading, without diagnosis of hypertension: Secondary | ICD-10-CM | POA: Diagnosis not present

## 2016-12-02 DIAGNOSIS — R29898 Other symptoms and signs involving the musculoskeletal system: Secondary | ICD-10-CM | POA: Diagnosis not present

## 2016-12-02 DIAGNOSIS — R200 Anesthesia of skin: Secondary | ICD-10-CM | POA: Diagnosis not present

## 2016-12-02 MED FILL — ESCITALOPRAM 10 MG TABLET: 10 | 30 days supply | Qty: 30 | Fill #0

## 2016-12-07 DIAGNOSIS — J343 Hypertrophy of nasal turbinates: Secondary | ICD-10-CM | POA: Diagnosis not present

## 2016-12-07 DIAGNOSIS — M270 Developmental disorders of jaws: Secondary | ICD-10-CM | POA: Diagnosis not present

## 2016-12-28 MED FILL — ESCITALOPRAM 10 MG TABLET: 10 | 30 days supply | Qty: 30 | Fill #1

## 2016-12-30 DIAGNOSIS — E7800 Pure hypercholesterolemia, unspecified: Secondary | ICD-10-CM | POA: Diagnosis not present

## 2016-12-30 DIAGNOSIS — Z79899 Other long term (current) drug therapy: Secondary | ICD-10-CM | POA: Diagnosis not present

## 2016-12-30 DIAGNOSIS — F3341 Major depressive disorder, recurrent, in partial remission: Secondary | ICD-10-CM | POA: Diagnosis not present

## 2017-01-04 MED FILL — ROSUVASTATIN CALCIUM 5 MG T: 5 | 90 days supply | Qty: 90 | Fill #0

## 2017-01-23 DIAGNOSIS — S51832A Puncture wound without foreign body of left forearm, initial encounter: Secondary | ICD-10-CM | POA: Diagnosis not present

## 2017-01-23 DIAGNOSIS — W5501XA Bitten by cat, initial encounter: Secondary | ICD-10-CM | POA: Diagnosis not present

## 2017-01-24 DIAGNOSIS — R3129 Other microscopic hematuria: Secondary | ICD-10-CM | POA: Diagnosis not present

## 2017-01-24 DIAGNOSIS — N281 Cyst of kidney, acquired: Secondary | ICD-10-CM | POA: Diagnosis not present

## 2017-01-25 ENCOUNTER — Other Ambulatory Visit: Payer: Self-pay | Admitting: Urology

## 2017-01-25 DIAGNOSIS — M21611 Bunion of right foot: Secondary | ICD-10-CM | POA: Diagnosis not present

## 2017-01-25 DIAGNOSIS — M21612 Bunion of left foot: Secondary | ICD-10-CM | POA: Diagnosis not present

## 2017-01-25 DIAGNOSIS — N281 Cyst of kidney, acquired: Secondary | ICD-10-CM

## 2017-01-25 DIAGNOSIS — M2012 Hallux valgus (acquired), left foot: Secondary | ICD-10-CM | POA: Diagnosis not present

## 2017-01-25 DIAGNOSIS — M2011 Hallux valgus (acquired), right foot: Secondary | ICD-10-CM | POA: Diagnosis not present

## 2017-02-07 MED FILL — ESCITALOPRAM 10 MG TABLET: 10 | 30 days supply | Qty: 30 | Fill #2

## 2017-02-08 ENCOUNTER — Ambulatory Visit (HOSPITAL_COMMUNITY)
Admission: RE | Admit: 2017-02-08 | Discharge: 2017-02-08 | Disposition: A | Discharge status: Home / Self Care | Payer: 59 | Source: Ambulatory Visit | Attending: Urology | Admitting: Urology

## 2017-02-08 DIAGNOSIS — N281 Cyst of kidney, acquired: Secondary | ICD-10-CM | POA: Insufficient documentation

## 2017-02-08 LAB — POCT I-STAT CREATININE: Creatinine, Ser: 0.8 mg/dL (ref 0.44–1.00)

## 2017-02-08 MED ORDER — GADOBENATE DIMEGLUMINE 529 MG/ML IV SOLN
10.0000 mL | Freq: Once | INTRAVENOUS | Status: AC | PRN
  Administered 2017-02-08: 10 mL via INTRAVENOUS

## 2017-02-17 ENCOUNTER — Other Ambulatory Visit: Payer: Self-pay | Admitting: Orthopedic Surgery

## 2017-03-14 ENCOUNTER — Encounter (HOSPITAL_BASED_OUTPATIENT_CLINIC_OR_DEPARTMENT_OTHER): Payer: Self-pay | Admitting: *Deleted

## 2017-03-21 MED FILL — ESCITALOPRAM 10 MG TABLET: 10 | 30 days supply | Qty: 30 | Fill #3

## 2017-03-23 ENCOUNTER — Encounter (HOSPITAL_BASED_OUTPATIENT_CLINIC_OR_DEPARTMENT_OTHER): Payer: Self-pay | Admitting: Certified Registered"

## 2017-03-23 ENCOUNTER — Encounter (HOSPITAL_BASED_OUTPATIENT_CLINIC_OR_DEPARTMENT_OTHER)
Admission: RE | Disposition: A | Discharge status: Home / Self Care | Payer: Self-pay | Source: Ambulatory Visit | Attending: Orthopedic Surgery

## 2017-03-23 ENCOUNTER — Ambulatory Visit (HOSPITAL_BASED_OUTPATIENT_CLINIC_OR_DEPARTMENT_OTHER)
Admission: RE | Admit: 2017-03-23 | Discharge: 2017-03-23 | Disposition: A | Discharge status: Home / Self Care | Payer: 59 | Source: Ambulatory Visit | Attending: Orthopedic Surgery | Admitting: Orthopedic Surgery

## 2017-03-23 ENCOUNTER — Ambulatory Visit (HOSPITAL_BASED_OUTPATIENT_CLINIC_OR_DEPARTMENT_OTHER): Payer: 59 | Admitting: Certified Registered"

## 2017-03-23 ENCOUNTER — Other Ambulatory Visit: Payer: Self-pay

## 2017-03-23 DIAGNOSIS — M21612 Bunion of left foot: Secondary | ICD-10-CM | POA: Diagnosis not present

## 2017-03-23 DIAGNOSIS — Z79899 Other long term (current) drug therapy: Secondary | ICD-10-CM | POA: Insufficient documentation

## 2017-03-23 DIAGNOSIS — G8918 Other acute postprocedural pain: Secondary | ICD-10-CM | POA: Diagnosis not present

## 2017-03-23 DIAGNOSIS — M2012 Hallux valgus (acquired), left foot: Secondary | ICD-10-CM | POA: Diagnosis not present

## 2017-03-23 DIAGNOSIS — F329 Major depressive disorder, single episode, unspecified: Secondary | ICD-10-CM | POA: Diagnosis not present

## 2017-03-23 DIAGNOSIS — M79672 Pain in left foot: Secondary | ICD-10-CM | POA: Diagnosis not present

## 2017-03-23 HISTORY — PX: BUNIONECTOMY WITH CHILECTOMY: SHX5598

## 2017-03-23 SURGERY — BUNIONECTOMY WITH CHILECTOMY
Anesthesia: General | Site: Foot | Laterality: Left

## 2017-03-23 MED ORDER — CEFAZOLIN SODIUM-DEXTROSE 2-4 GM/100ML-% IV SOLN
2.0000 g | INTRAVENOUS | Status: AC
  Administered 2017-03-23: 2 g via INTRAVENOUS

## 2017-03-23 MED ORDER — LIDOCAINE HCL (CARDIAC) 20 MG/ML IV SOLN
INTRAVENOUS | Status: DC | PRN
Start: 2017-03-23 — End: 2017-03-23
  Administered 2017-03-23: 30 mg via INTRAVENOUS

## 2017-03-23 MED ORDER — CHLORHEXIDINE GLUCONATE 4 % EX LIQD: 60.0000 mL | Freq: Once | CUTANEOUS | Status: DC

## 2017-03-23 MED ORDER — BUPIVACAINE-EPINEPHRINE (PF) 0.5% -1:200000 IJ SOLN
INTRAMUSCULAR | Status: DC | PRN
  Administered 2017-03-23: 30 mL via PERINEURAL

## 2017-03-23 MED ORDER — CEFAZOLIN SODIUM-DEXTROSE 2-4 GM/100ML-% IV SOLN
INTRAVENOUS | Status: AC
  Filled 2017-03-23: qty 100

## 2017-03-23 MED ORDER — FENTANYL CITRATE (PF) 100 MCG/2ML IJ SOLN
50.0000 ug | INTRAMUSCULAR | Status: DC | PRN
  Administered 2017-03-23: 50 ug via INTRAVENOUS

## 2017-03-23 MED ORDER — OXYCODONE-ACETAMINOPHEN 5-325 MG PO TABS: 1.0000 | ORAL_TABLET | Freq: Four times a day (QID) | ORAL | 0 refills | Status: DC | PRN

## 2017-03-23 MED ORDER — PROPOFOL 10 MG/ML IV BOLUS
INTRAVENOUS | Status: DC | PRN
  Administered 2017-03-23: 120 mg via INTRAVENOUS

## 2017-03-23 MED ORDER — FENTANYL CITRATE (PF) 100 MCG/2ML IJ SOLN: 25.0000 ug | INTRAMUSCULAR | Status: DC | PRN

## 2017-03-23 MED ORDER — MIDAZOLAM HCL 2 MG/2ML IJ SOLN
INTRAMUSCULAR | Status: AC
  Filled 2017-03-23: qty 2

## 2017-03-23 MED ORDER — ONDANSETRON HCL 4 MG/2ML IJ SOLN: 4.0000 mg | Freq: Once | INTRAMUSCULAR | Status: DC | PRN

## 2017-03-23 MED ORDER — ONDANSETRON HCL 4 MG/2ML IJ SOLN
INTRAMUSCULAR | Status: DC | PRN
  Administered 2017-03-23: 4 mg via INTRAVENOUS

## 2017-03-23 MED ORDER — MIDAZOLAM HCL 2 MG/2ML IJ SOLN
1.0000 mg | INTRAMUSCULAR | Status: DC | PRN
  Administered 2017-03-23: 2 mg via INTRAVENOUS

## 2017-03-23 MED ORDER — FENTANYL CITRATE (PF) 100 MCG/2ML IJ SOLN
INTRAMUSCULAR | Status: AC
  Filled 2017-03-23: qty 2

## 2017-03-23 MED ORDER — LACTATED RINGERS IV SOLN
INTRAVENOUS | Status: DC
  Administered 2017-03-23 (×2): via INTRAVENOUS

## 2017-03-23 MED ORDER — DEXAMETHASONE SODIUM PHOSPHATE 10 MG/ML IJ SOLN
INTRAMUSCULAR | Status: DC | PRN
Start: 2017-03-23 — End: 2017-03-23
  Administered 2017-03-23: 6 mg via INTRAVENOUS

## 2017-03-23 MED ORDER — SCOPOLAMINE 1 MG/3DAYS TD PT72: 1.0000 | MEDICATED_PATCH | Freq: Once | TRANSDERMAL | Status: DC | PRN

## 2017-03-23 MED FILL — OXYCODONE-ACETAMINOPHEN 5-3: 5-325 | 5 days supply | Qty: 40 | Fill #0

## 2017-03-23 SURGICAL SUPPLY — 75 items
BANDAGE ACE 3X5.8 VEL STRL LF (GAUZE/BANDAGES/DRESSINGS) ×2 IMPLANT
BLADE AVERAGE 25MMX9MM (BLADE) ×1
BLADE AVERAGE 25X9 (BLADE) ×1 IMPLANT
BLADE CRESCENTIC 13.5X.38X32 (BLADE) ×5 IMPLANT
BLADE OSC/SAG .038X5.5 CUT EDG (BLADE) ×2 IMPLANT
BLADE SURG 15 STRL LF DISP TIS (BLADE) ×2 IMPLANT
BLADE SURG 15 STRL SS (BLADE) ×6
BNDG CMPR 9X4 STRL LF SNTH (GAUZE/BANDAGES/DRESSINGS) ×1
BNDG CONFORM 2 STRL LF (GAUZE/BANDAGES/DRESSINGS) ×3 IMPLANT
BNDG ELASTIC 2X5.8 VLCR STR LF (GAUZE/BANDAGES/DRESSINGS) ×8 IMPLANT
BNDG ESMARK 4X9 LF (GAUZE/BANDAGES/DRESSINGS) ×3 IMPLANT
CANISTER SUCT 1200ML W/VALVE (MISCELLANEOUS) IMPLANT
COVER BACK TABLE 60X90IN (DRAPES) ×3 IMPLANT
CUFF TOURNIQUET SINGLE 18IN (TOURNIQUET CUFF) ×2 IMPLANT
CUFF TOURNIQUET SINGLE 24IN (TOURNIQUET CUFF) IMPLANT
DECANTER SPIKE VIAL GLASS SM (MISCELLANEOUS) IMPLANT
DRAPE EXTREMITY T 121X128X90 (DRAPE) ×3 IMPLANT
DRAPE IMP U-DRAPE 54X76 (DRAPES) ×3 IMPLANT
DRAPE OEC MINIVIEW 54X84 (DRAPES) ×3 IMPLANT
DRAPE U-SHAPE 47X51 STRL (DRAPES) ×3 IMPLANT
DRSG EMULSION OIL 3X3 NADH (GAUZE/BANDAGES/DRESSINGS) ×2 IMPLANT
DURAPREP 26ML APPLICATOR (WOUND CARE) ×3 IMPLANT
ELECT REM PT RETURN 9FT ADLT (ELECTROSURGICAL) ×3
ELECTRODE REM PT RTRN 9FT ADLT (ELECTROSURGICAL) ×1 IMPLANT
GAUZE SPONGE 4X4 12PLY STRL (GAUZE/BANDAGES/DRESSINGS) ×3 IMPLANT
GAUZE XEROFORM 1X8 LF (GAUZE/BANDAGES/DRESSINGS) IMPLANT
GLOVE BIOGEL PI IND STRL 7.0 (GLOVE) IMPLANT
GLOVE BIOGEL PI IND STRL 8 (GLOVE) ×2 IMPLANT
GLOVE BIOGEL PI INDICATOR 7.0 (GLOVE) ×2
GLOVE BIOGEL PI INDICATOR 8 (GLOVE) ×6
GLOVE ECLIPSE 6.5 STRL STRAW (GLOVE) ×2 IMPLANT
GLOVE ECLIPSE 7.5 STRL STRAW (GLOVE) ×6 IMPLANT
GOWN STRL REUS W/ TWL LRG LVL3 (GOWN DISPOSABLE) ×1 IMPLANT
GOWN STRL REUS W/ TWL XL LVL3 (GOWN DISPOSABLE) ×1 IMPLANT
GOWN STRL REUS W/TWL LRG LVL3 (GOWN DISPOSABLE) ×3
GOWN STRL REUS W/TWL XL LVL3 (GOWN DISPOSABLE) ×6 IMPLANT
K-WIRE .045X4 (WIRE) IMPLANT
KIT ORTHOSORB 1-PIN  2.0X40 (Joint) ×2 IMPLANT
KIT ORTHOSORB 1-PIN 2.0X40 (Joint) IMPLANT
NDL 1/2 CIR CATGUT .05X1.09 (NEEDLE) IMPLANT
NDL ADDISON D1/2 CIR (NEEDLE) IMPLANT
NDL HYPO 25X1 1.5 SAFETY (NEEDLE) IMPLANT
NEEDLE 1/2 CIR CATGUT .05X1.09 (NEEDLE) IMPLANT
NEEDLE ADDISON D1/2 CIR (NEEDLE) IMPLANT
NEEDLE HYPO 25X1 1.5 SAFETY (NEEDLE) IMPLANT
NS IRRIG 1000ML POUR BTL (IV SOLUTION) ×3 IMPLANT
PACK BASIN DAY SURGERY FS (CUSTOM PROCEDURE TRAY) ×3 IMPLANT
PAD CAST 3X4 CTTN HI CHSV (CAST SUPPLIES) IMPLANT
PADDING CAST ABS 4INX4YD NS (CAST SUPPLIES) ×2
PADDING CAST ABS COTTON 4X4 ST (CAST SUPPLIES) ×1 IMPLANT
PADDING CAST COTTON 3X4 STRL (CAST SUPPLIES)
PADDING UNDERCAST 2 STRL (CAST SUPPLIES) ×2
PADDING UNDERCAST 2X4 STRL (CAST SUPPLIES) ×1 IMPLANT
PENCIL BUTTON HOLSTER BLD 10FT (ELECTRODE) ×3 IMPLANT
SLEEVE SCD COMPRESS KNEE MED (MISCELLANEOUS) ×3 IMPLANT
STOCKINETTE 4X48 STRL (DRAPES) ×3 IMPLANT
SUCTION FRAZIER HANDLE 10FR (MISCELLANEOUS) ×2
SUCTION TUBE FRAZIER 10FR DISP (MISCELLANEOUS) ×1 IMPLANT
SUT ETHILON 3 0 PS 1 (SUTURE) IMPLANT
SUT ETHILON 4 0 PS 2 18 (SUTURE) IMPLANT
SUT FIBERWIRE 2-0 18 17.9 3/8 (SUTURE) ×3
SUT FIBERWIRE 3-0 18 TAPR NDL (SUTURE) ×6
SUT FIBERWIRE 4-0 18 TAPR NDL (SUTURE)
SUT VIC AB 2-0 PS2 27 (SUTURE) IMPLANT
SUT VIC AB 3-0 FS2 27 (SUTURE) IMPLANT
SUTURE FIBERWR 2-0 18 17.9 3/8 (SUTURE) IMPLANT
SUTURE FIBERWR 3-0 18 TAPR NDL (SUTURE) IMPLANT
SUTURE FIBERWR 4-0 18 TAPR NDL (SUTURE) IMPLANT
SYR BULB 3OZ (MISCELLANEOUS) ×3 IMPLANT
SYR CONTROL 10ML LL (SYRINGE) IMPLANT
TOWEL OR 17X24 6PK STRL BLUE (TOWEL DISPOSABLE) ×3 IMPLANT
TOWEL OR NON WOVEN STRL DISP B (DISPOSABLE) ×3 IMPLANT
TUBE CONNECTING 20'X1/4 (TUBING) ×1
TUBE CONNECTING 20X1/4 (TUBING) ×2 IMPLANT
UNDERPAD 30X30 (UNDERPADS AND DIAPERS) ×3 IMPLANT

## 2017-03-23 NOTE — Discharge Instructions (Signed)

## 2017-03-23 NOTE — H&P (Signed)
PREOPERATIVE H&P  Chief Complaint: l foot pain and deformity  HPI: Barbara Greene is a 61 y.o. female who presents for evaluation of  l foot pain and deformity. It has been present for greater than 1 year and has been worsening. She has failed conservative measures. Pain is rated as moderate.  Past Medical History:  Diagnosis Date  . Medical history non-contributory    Past Surgical History:  Procedure Laterality Date  . CESAREAN SECTION    . COLONOSCOPY WITH PROPOFOL N/A 06/08/2016   Procedure: COLONOSCOPY WITH PROPOFOL;  Surgeon: Garlan Fair, MD;  Location: [facility];  Service: Endoscopy;  Laterality: N/A;   Social History   Socioeconomic History  . Marital status: Married    Spouse name: None  . Number of children: None  . Years of education: None  . Highest education level: None  Social Needs  . Financial resource strain: None  . Food insecurity - worry: None  . Food insecurity - inability: None  . Transportation needs - medical: None  . Transportation needs - non-medical: None  Occupational History  . None  Tobacco Use  . Smoking status: Never Smoker  . Smokeless tobacco: Never Used  Substance and Sexual Activity  . Alcohol use: Yes    Comment: occasional   . Drug use: No  . Sexual activity: None  Other Topics Concern  . None  Social History Narrative  . None   History reviewed. No pertinent family history. No Known Allergies Prior to Admission medications   Medication Sig Start Date End Date Taking? Authorizing Provider  escitalopram (LEXAPRO) 10 MG tablet Take 10 mg daily by mouth.   Yes [provider]  atorvastatin (LIPITOR) 40 MG tablet Take 40 mg by mouth every evening.    [provider]     Positive ROS: none  All other systems have been reviewed and were otherwise negative with the exception of those mentioned in the HPI and as above.  Physical Exam: There were no vitals filed for this visit.  General: Alert, no  acute distress Cardiovascular: No pedal edema Respiratory: No cyanosis, no use of accessory musculature GI: No organomegaly, abdomen is soft and non-tender Skin: No lesions in the area of chief complaint Neurologic: Sensation intact distally Psychiatric: Patient is competent for consent with normal mood and affect Lymphatic: No axillary or cervical lymphadenopathy  MUSCULOSKELETAL: l foot severe hallux valgus with painful rom  Assessment/Plan: PAINFUL BUNION LEFT FOOT WITH HALLUX VALGUS DEFORMITY Plan for Procedure(s): Proximal dome  OSTEOTOMY WITH distal soft tissue release and BUNIONECTOMY  The risks benefits and alternatives were discussed with the patient including but not limited to the risks of nonoperative treatment, versus surgical intervention including infection, bleeding, nerve injury, malunion, nonunion, hardware prominence, hardware failure, need for hardware removal, blood clots, cardiopulmonary complications, morbidity, mortality, among others, and they were willing to proceed.  Predicted outcome is good, although there will be at least a six to nine month expected recovery.  Alta Corning, MD 03/23/2017 7:30 AM

## 2017-03-23 NOTE — Anesthesia Procedure Notes (Signed)
Procedure Name: LMA Insertion Date/Time: 03/23/2017 10:04 AM Performed by: Signe Colt, CRNA Pre-anesthesia Checklist: Patient identified, Emergency Drugs available, Suction available and Patient being monitored Patient Re-evaluated:Patient Re-evaluated prior to induction Oxygen Delivery Method: Circle system utilized Preoxygenation: Pre-oxygenation with 100% oxygen Induction Type: IV induction Ventilation: Mask ventilation without difficulty LMA: LMA inserted LMA Size: 3.0 Number of attempts: 1 Airway Equipment and Method: Bite block Placement Confirmation: positive ETCO2 Tube secured with: Tape Dental Injury: Teeth and Oropharynx as per pre-operative assessment

## 2017-03-23 NOTE — Anesthesia Postprocedure Evaluation (Signed)
Anesthesia Post Note  Patient: Barbara Greene  Procedure(s) Performed: CHEVRON OSTEOTOMY WITH BUNIONECTOMY (Left Foot)     Patient location during evaluation: PACU Anesthesia Type: General Level of consciousness: awake and alert, oriented and awake Pain management: pain level controlled Vital Signs Assessment: post-procedure vital signs reviewed and stable Respiratory status: spontaneous breathing, nonlabored ventilation, respiratory function stable and patient connected to nasal cannula oxygen Cardiovascular status: blood pressure returned to baseline and stable Postop Assessment: no apparent nausea or vomiting Anesthetic complications: no    Last Vitals:  Vitals:   03/23/17 1230 03/23/17 1245  BP: 130/61 132/67  Pulse: 98 (!) 103  Resp: 13 12  Temp:    SpO2: 100% 100%    Last Pain:  Vitals:   03/23/17 1300  TempSrc:   PainSc: 0-No pain                 Catalina Gravel

## 2017-03-23 NOTE — Transfer of Care (Signed)
Immediate Anesthesia Transfer of Care Note  Patient: Barbara Greene  Procedure(s) Performed: CHEVRON OSTEOTOMY WITH BUNIONECTOMY (Left Foot)  Patient Location: PACU  Anesthesia Type:GA combined with regional for post-op pain  Level of Consciousness: awake, alert , oriented and patient cooperative  Airway & Oxygen Therapy: Patient Spontanous Breathing and Patient connected to face mask oxygen  Post-op Assessment: Report given to RN and Post -op Vital signs reviewed and stable  Post vital signs: Reviewed and stable  Last Vitals:  Vitals:   03/23/17 0930 03/23/17 0945  BP: (!) 129/57   Pulse: 81 83  Resp: 14 18  Temp:    SpO2: 100% 100%    Last Pain:  Vitals:   03/23/17 0833  TempSrc: Oral         Complications: No apparent anesthesia complications

## 2017-03-23 NOTE — Progress Notes (Signed)
Assisted Dr. Turk with left, ultrasound guided, popliteal/saphenous block. Side rails up, monitors on throughout procedure. See vital signs in flow sheet. Tolerated Procedure well. 

## 2017-03-23 NOTE — Anesthesia Preprocedure Evaluation (Signed)
Anesthesia Evaluation  Patient identified by MRN, date of birth, ID band Patient awake    Reviewed: Allergy & Precautions, NPO status , Patient's Chart, lab work & pertinent test results  Airway Mallampati: II  TM Distance: >3 FB Neck ROM: Full    Dental  (+) Teeth Intact, Dental Advisory Given   Pulmonary neg pulmonary ROS,    Pulmonary exam normal breath sounds clear to auscultation       Cardiovascular negative cardio ROS Normal cardiovascular exam Rhythm:Regular Rate:Normal     Neuro/Psych PSYCHIATRIC DISORDERS Depression negative neurological ROS     GI/Hepatic negative GI ROS, Neg liver ROS,   Endo/Other  negative endocrine ROS  Renal/GU negative Renal ROS     Musculoskeletal Painful Bunion   Abdominal   Peds  Hematology negative hematology ROS (+)   Anesthesia Other Findings Day of surgery medications reviewed with the patient.  Reproductive/Obstetrics                             Anesthesia Physical Anesthesia Plan  ASA: II  Anesthesia Plan: General   Post-op Pain Management:  Regional for Post-op pain   Induction: Intravenous  PONV Risk Score and Plan: 3 and Ondansetron, Dexamethasone and Midazolam  Airway Management Planned: LMA  Additional Equipment:   Intra-op Plan:   Post-operative Plan: Extubation in OR  Informed Consent: I have reviewed the patients History and Physical, chart, labs and discussed the procedure including the risks, benefits and alternatives for the proposed anesthesia with the patient or authorized representative who has indicated his/her understanding and acceptance.   Dental advisory given  Plan Discussed with: CRNA  Anesthesia Plan Comments: (Risks/benefits of general anesthesia discussed with patient including risk of damage to teeth, lips, gum, and tongue, nausea/vomiting, allergic reactions to medications, and the possibility of heart  attack, stroke and death.  All patient questions answered.  Patient wishes to proceed.)        Anesthesia Quick Evaluation

## 2017-03-23 NOTE — Anesthesia Procedure Notes (Signed)
Anesthesia Regional Block: Popliteal block   Pre-Anesthetic Checklist: ,, timeout performed, Correct Patient, Correct Site, Correct Laterality, Correct Procedure, Correct Position, site marked, Risks and benefits discussed,  Surgical consent,  Pre-op evaluation,  At surgeon's request and post-op pain management  Laterality: Left  Prep: chloraprep       Needles:  Injection technique: Single-shot  Needle Type: Echogenic Needle     Needle Length: 9cm  Needle Gauge: 21     Additional Needles:   Procedures:,,,, ultrasound used (permanent image in chart),,,,  Narrative:  Start time: 03/23/2017 8:58 AM End time: 03/23/2017 9:05 AM Injection made incrementally with aspirations every 5 mL.  Performed by: Personally  Anesthesiologist: Catalina Gravel, MD  Additional Notes: No pain on injection. No increased resistance to injection. Injection made in 5cc increments.  Good needle visualization.  Patient tolerated procedure well.  Combined left popliteal/saphenous nerve block.

## 2017-03-24 ENCOUNTER — Encounter (HOSPITAL_BASED_OUTPATIENT_CLINIC_OR_DEPARTMENT_OTHER): Payer: Self-pay | Admitting: Orthopedic Surgery

## 2017-03-25 NOTE — Brief Op Note (Signed)
03/23/2017  9:13 AM  PATIENT:  Barbara Greene  61 y.o. female  PRE-OPERATIVE DIAGNOSIS:  PAINFUL BUNION LEFT FOOT WITH HALLUX VALGUS DEFORMITY  POST-OPERATIVE DIAGNOSIS:  PAINFUL BUNION LEFT FOOT WITH HALLUX VALGUS   PROCEDURE:  Procedure(s): CHEVRON OSTEOTOMY WITH BUNIONECTOMY (Left)  SURGEON:  Surgeon(s) and Role:    Dorna Leitz, MD - Primary  PHYSICIAN ASSISTANT:   ASSISTANTS: bethune   ANESTHESIA:   general  EBL:  none   BLOOD ADMINISTERED:none  DRAINS: none   LOCAL MEDICATIONS USED:  MARCAINE     SPECIMEN:  No Specimen  DISPOSITION OF SPECIMEN:  N/A  COUNTS:  YES  TOURNIQUET:   Total Tourniquet Time Documented: Thigh (Left) - 95 minutes Total: Thigh (Left) - 95 minutes   DICTATION: .Other Dictation: Dictation Number [id number]  PLAN OF CARE: Discharge to home after PACU  PATIENT DISPOSITION:  PACU - hemodynamically stable.   Delay start of Pharmacological VTE agent (>24hrs) due to surgical blood loss or risk of bleeding: no

## 2017-03-28 NOTE — Op Note (Signed)
NAME:  Barbara Greene, Barbara Greene                 ACCOUNT NO.:  MEDICAL RECORD NO.:  [id number]  LOCATION:                                 FACILITY:  PHYSICIAN:  Alta Corning, M.D.        DATE OF BIRTH:  DATE OF PROCEDURE:  03/23/2017 DATE OF DISCHARGE:                              OPERATIVE REPORT   PREOPERATIVE DIAGNOSIS:  Significant bunion deformity with significant hallux valgus distally with painful medial eminence.  POSTOPERATIVE DIAGNOSIS:  Significant bunion deformity with significant hallux valgus distally with painful medial eminence.  PROCEDURES: 1. Correction of hallux valgus with distal soft tissue realignment. 2. Distal Chevron osteotomy with internal fixation with bioabsorbable     pin. 3. Interpretation of multiple intraoperative fluoroscopic images.  SURGEON:  Alta Corning, MD.  ASSISTANT:  Gary Fleet, PA.  ANESTHESIA:  General.  BRIEF HISTORY:  Mrs. Caporaso is a 61 year old female with a long history and significant complaints of right foot pain.  She had been treated conservatively for prolonged period of time.  After failure of all conservative care, she was taken to the operating room for bunion correction.  She had a very large medial eminence with what appeared to be sort of a fluid-filled collection over that medial eminence; and even though she had a large intermetatarsal angle, we felt that distal Chevron may be satisfactory with the removal of the medial eminence and after discussion, she was taken to the operating room for this procedure.  DESCRIPTION OF PROCEDURE:  The patient was taken to the operating room after adequate anesthesia was obtained with general anesthetic.  The patient was placed supine on the operating table.  Multiple intraoperative fluoroscopic images were taken at that point to measure angles in such and we were satisfied that she had a significant intermetatarsal angle, but her foot actually did not look quite as bad as  what the intermetatarsal angle.  At that point, we talked to her about the possibility of proximal osteotomy versus distal.  I felt like we might be able to get it with the distal osteotomy, which I thought she would recover from a little easier.  So, at this point, the foot was prepped and draped in the usual sterile fashion.  The leg was exsanguinated.  Blood pressure tourniquet was inflated to 250 mmHg. Following this, an incision was made in the 1-2 interspace. Subcutaneous tissue was taken down the level of the capsule.  The adductor was then identified and released off the proximal phalanx, doing away with the deforming force in a medial and lateral direction. Once this was done, the stitches were placed through the capsular head on the great toe side through the adductor and then on the capsule head on the second toe.  These 3 stitches were passed, FiberWire stitches. Once this was completed, attention was turned towards ruffling the capsule over the metatarsophalangeal joint to allow the toe to be pulled into 60 degrees of varus, so the capsules opened quite nicely at this point.  Attention was then turned towards the incision on the medial side and the incision was made.  Subcutaneous tissue was taken down to the level of  the capsule.  There was a bursa that was removed.  The capsule was identified and horizontal incisions were made perpendicular to the long axis of the toe to allow for later correction of the capsule.  At this point, the medial eminence was identified and removed with a saw.  At this point, we now took a long look at what was going on with the toe versus the alignment and I felt that a distal Danton Clap would get the job done here.  So, angle cuts of the 60-degree interval were made with an ending at the center of the metatarsal head.  Once that was done, the metatarsal head was released and then moved in a medial direction dramatically and once we were satisfied  that the head was moved over sufficiently, the fluoro was used to make sure they were fine.  A bioabsorbable pin was placed through the metatarsal and into the head.  Once that was completed, the remaining medial eminences were removed and the attention was turned towards the closure of the 1-2 interspace.  So, the 3 stitches that had been passed previously in the metatarsal head of the first and second metatarsals were tied sequentially and excellent pull of the metatarsals now over in that second direction was completed and then the attention was turned towards the capsule in a pants-over-vest fashion.  The capsule was closed holding the toe in about a 10-degree varus position just to make sure that we had a good correction.  The great toe was now sort of lying next to the second toe and the foot looks like it had been narrowed nicely with external alignment distally.  The 1/2 interspace in terms of the distal metatarsal heads were now parallel.  At this point, after the capsule was being closed, the superior extension of the capsule was closed and then the wounds were all irrigated, closed in layers. Sterile compressive dressing was applied as well as a toe spica splint and a hard-soled shoe, and the patient was taken to the recovery room where she was noted to be in satisfactory condition.  Estimated blood loss for the procedure was minimal, and again multiple intraoperative fluoroscopic images were taken to assess the case throughout.     Alta Corning, M.D.     Corliss Skains  D:  03/25/2017  T:  03/26/2017  Job:  [id number]

## 2017-03-31 NOTE — Op Note (Signed)
NAME:  Barbara Greene, Barbara Greene NO.:  MEDICAL RECORD NO.:  [id number]  LOCATION:                                 FACILITY:  PHYSICIAN:  Alta Corning, M.D.        DATE OF BIRTH:  DATE OF PROCEDURE:  03/23/2017 DATE OF DISCHARGE:                              OPERATIVE REPORT   ADDENDUM:  The surgical procedure was performed on the left foot. Inadvertently, the operative note had said right foot, but all procedures were performed on the left foot only.     Alta Corning, M.D.     Corliss Skains  D:  03/29/2017  T:  03/30/2017  Job:  [id number]

## 2017-04-07 DIAGNOSIS — M2012 Hallux valgus (acquired), left foot: Secondary | ICD-10-CM | POA: Diagnosis not present

## 2017-04-07 DIAGNOSIS — Z9889 Other specified postprocedural states: Secondary | ICD-10-CM | POA: Diagnosis not present

## 2017-04-23 DIAGNOSIS — M2012 Hallux valgus (acquired), left foot: Secondary | ICD-10-CM | POA: Diagnosis not present

## 2017-04-25 MED FILL — ESCITALOPRAM 10 MG TABLET: 10 | 30 days supply | Qty: 30 | Fill #4

## 2017-06-01 MED FILL — ESCITALOPRAM 10 MG TABLET: 10 | 30 days supply | Qty: 30 | Fill #5

## 2017-07-11 MED FILL — ESCITALOPRAM 10 MG TABLET: 10 | 30 days supply | Qty: 30 | Fill #6

## 2017-08-22 MED FILL — ESCITALOPRAM 10 MG TABLET: 10 | 30 days supply | Qty: 30 | Fill #7

## 2017-09-22 MED FILL — ESCITALOPRAM 10 MG TABLET: 10 | 30 days supply | Qty: 30 | Fill #8

## 2017-11-01 MED FILL — ESCITALOPRAM 10 MG TABLET: 10 | 30 days supply | Qty: 30 | Fill #9

## 2017-12-06 MED FILL — ESCITALOPRAM 10 MG TABLET: 10 | 90 days supply | Qty: 90 | Fill #0

## 2017-12-21 MED FILL — ROSUVASTATIN CALCIUM 5 MG T: 5 | 90 days supply | Qty: 90 | Fill #1

## 2018-02-03 IMAGING — MR MR ABDOMEN WO/W CM
10 of 18 series · 22 of 48 positions shown · IV contrast (multihance)
Comparison: MRI of the abdomen 03/03/2015. CT the abdomen and
pelvis 01/30/2009.

CLINICAL DATA: 61-year-old female with history of renal cysts.
Follow-up study.

EXAM:
MRI ABDOMEN WITHOUT AND WITH CONTRAST
TECHNIQUE: Multiplanar multisequence MR imaging of the abdomen was performed
both before and after the administration of intravenous contrast.
CONTRAST:  10mL MULTIHANCE GADOBENATE DIMEGLUMINE 529 MG/ML IV SOLN

[Series 3: DWI b500 · axial · 6.0mm · 1.41mm/px · z∈[-98,+167]mm · 4 of 70 slices shown]
[im 1/70]
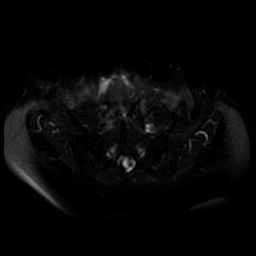
[im 24/70]
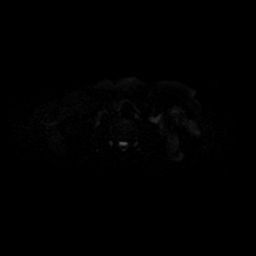
[im 47/70]
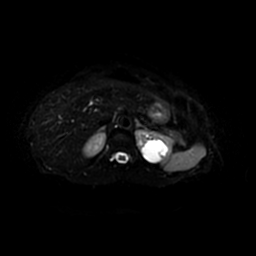
[im 70/70]
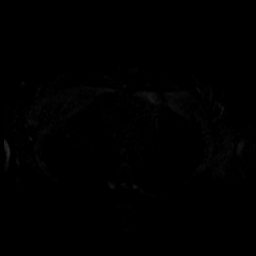

[Series 4: T2 fat-sat · axial · 5.0mm · 0.72mm/px · z∈[-69,+161]mm · 2 of 47 slices shown]
[im 1/47]
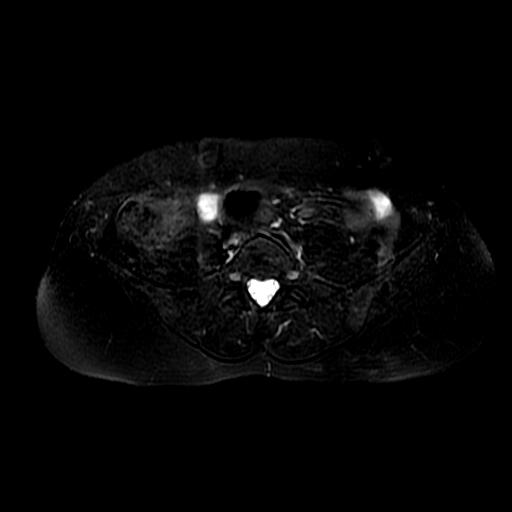
[im 47/47]
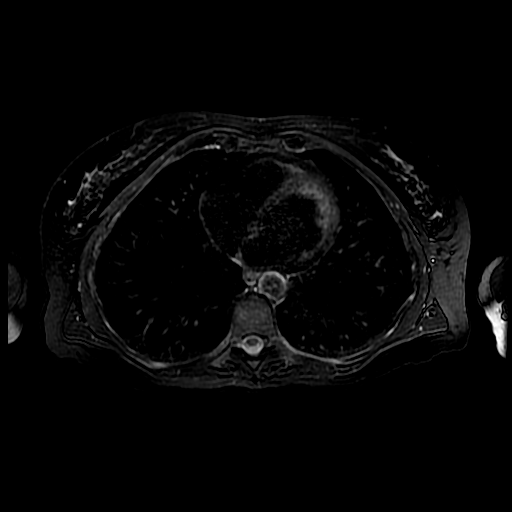

[Series 5: ax dualecho · axial · 5.0mm · 0.72mm/px · z∈[-69,+161]mm · 3 of 94 slices shown]
[im 1/94]
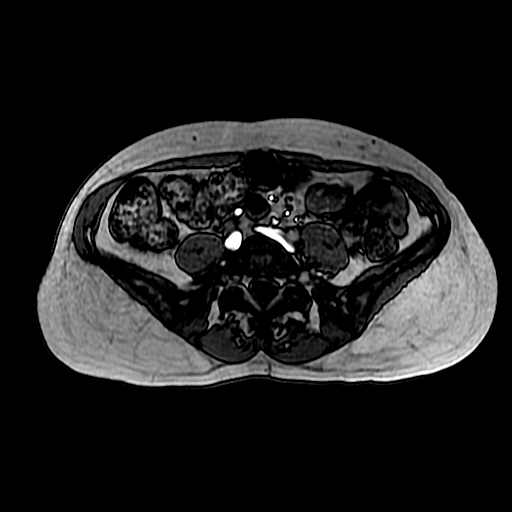
[im 47/94]
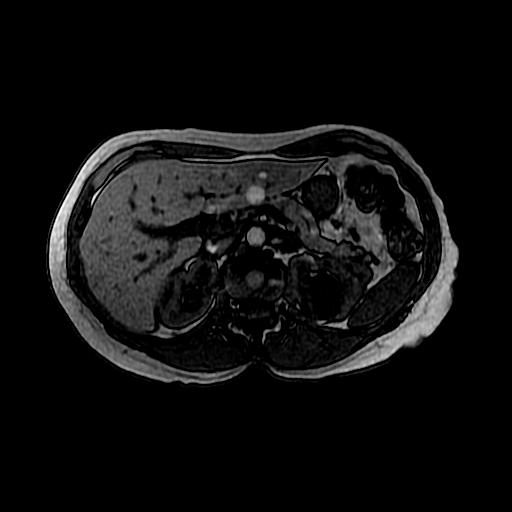
[im 94/94]
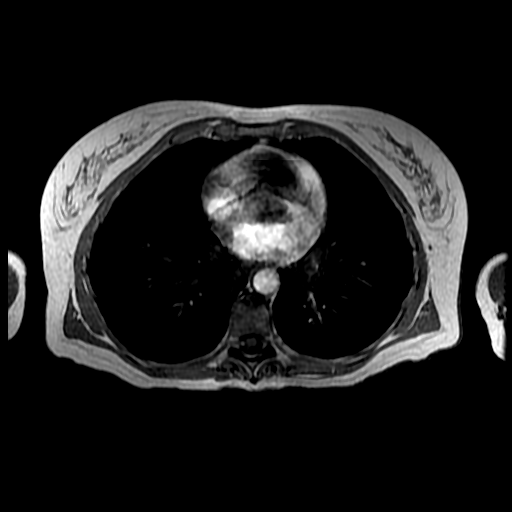

[Series 6: T2 · axial · 5.0mm · 0.72mm/px · z∈[-69,+161]mm · 2 of 47 slices shown (1 of 2)]
[im 1/47]
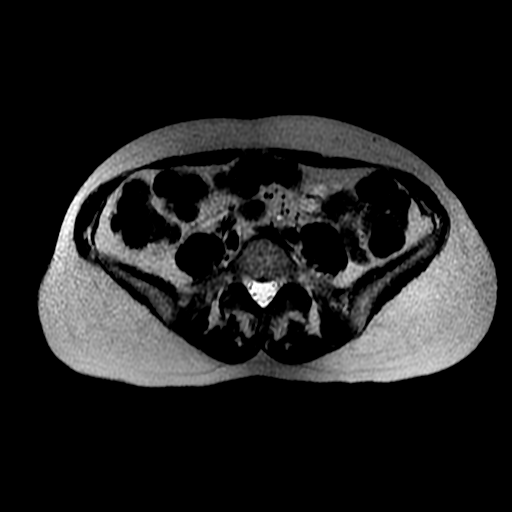
[im 47/47]
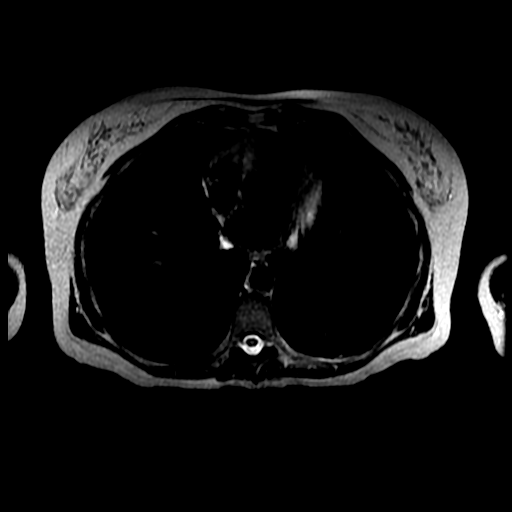

[Series 7: T2 · coronal · 5.0mm · 0.72mm/px · 1 of 31 slices shown (2 of 2)]
[im 1/31]
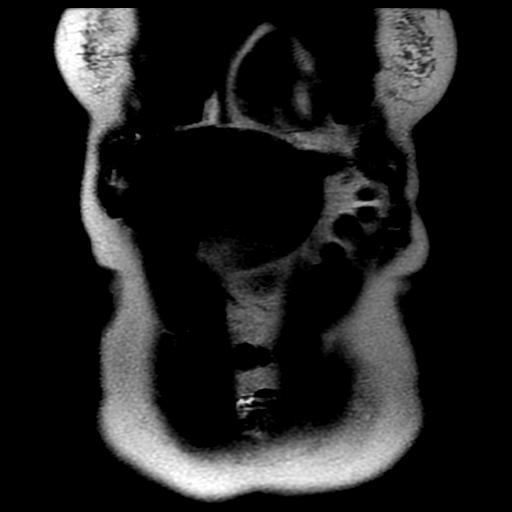

[Series 8: bSSFP · axial · 5.0mm · 0.72mm/px · z∈[-69,+161]mm · 2 of 47 slices shown]
[im 1/47]
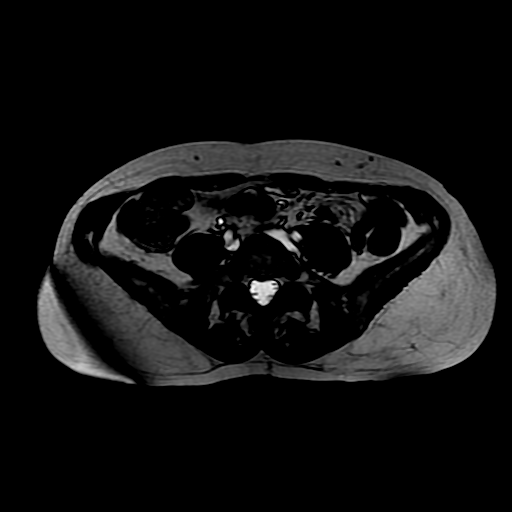
[im 47/47]
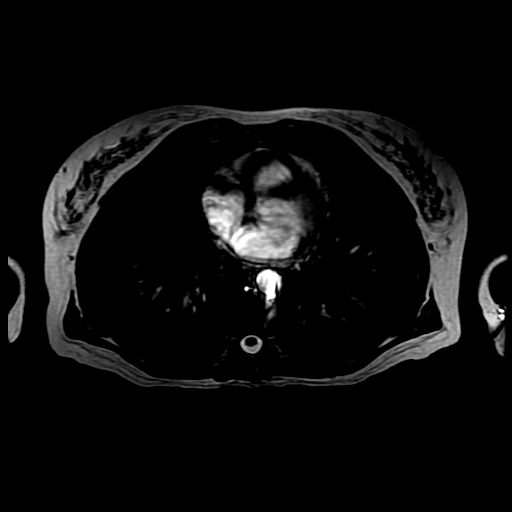

[Series 300: DWI · axial · 6.0mm · 1.41mm/px · 1 of 35 slices shown]
[im 1/35]
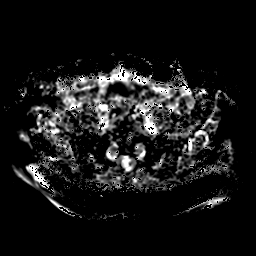

[Series 900: T1 dynamic · axial · 5.0mm · 0.74mm/px · z∈[-91,+127]mm · 3 of 88 slices shown (1 of 3)]
[im 1/88]
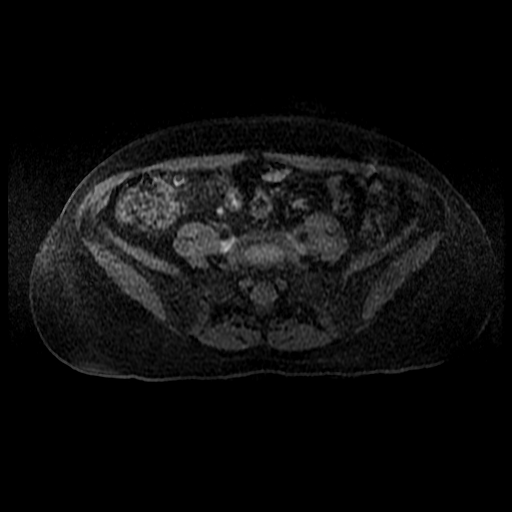
[im 44/88]
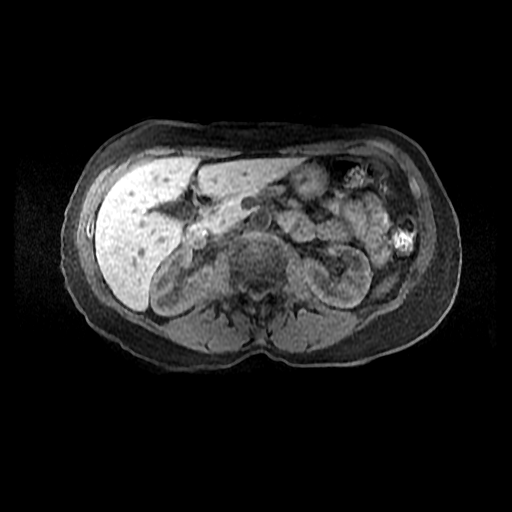
[im 88/88]
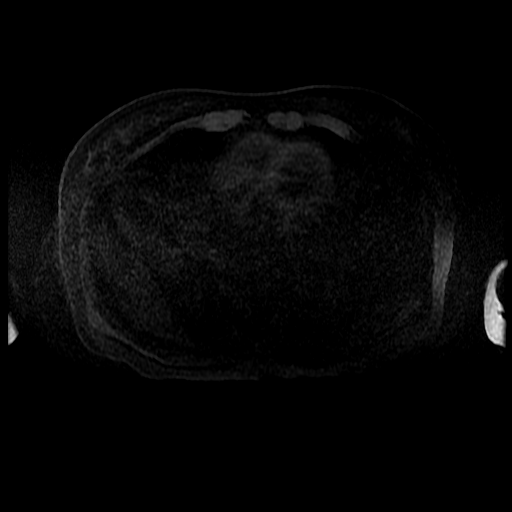

[Series 901: T1 dynamic · axial · 5.0mm · 0.74mm/px · z∈[-91,+127]mm · 3 of 88 slices shown (2 of 3)]
[im 1/88]
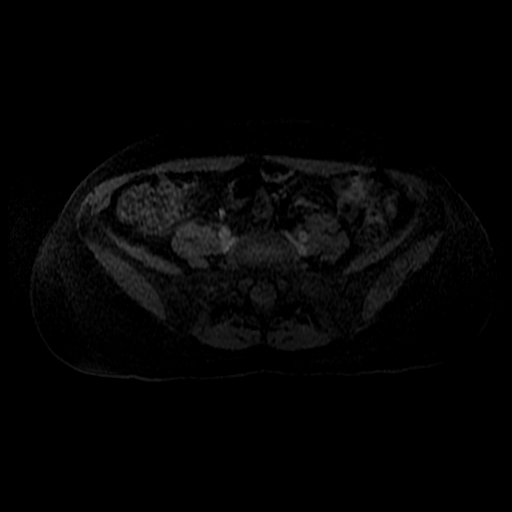
[im 44/88]
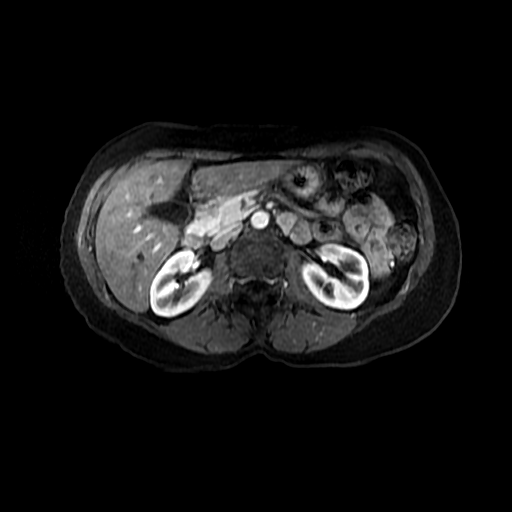
[im 88/88]
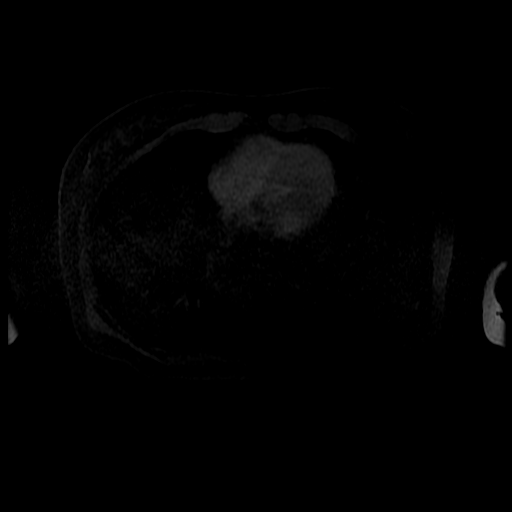

[Series 902: T1 dynamic · axial · 5.0mm · 0.74mm/px · 1 of 88 slices shown (3 of 3)]
[im 1/88]
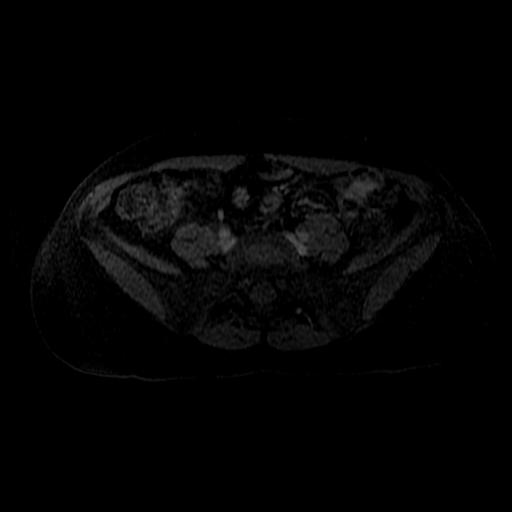

[22 of 48 positions shown; findings below may reference images not displayed]

FINDINGS: Lower chest: Unremarkable.

Hepatobiliary: No suspicious cystic or solid hepatic lesions. No
intra or extrahepatic biliary ductal dilatation. Gallbladder is
normal in appearance.

Pancreas: No pancreatic mass. No pancreatic ductal dilatation. No
pancreatic or peripancreatic fluid or inflammatory changes.

Spleen:  Unremarkable.

Adrenals/Urinary Tract: Numerous bilateral renal lesions are again
noted. The largest renal lesion is in the posterior aspect of the
upper pole of the left kidney measuring approximately 3.5 x 4.0 x
3.0 cm (axial image 24 of series 6 and coronal image 21 of series
7), similar in size to the prior study, with multiple thin internal
septations, predominantly T1 hypointense, T2 hyperintense, and
without internal enhancement, with a trace amount of dependent T1
hyperintensity (presumably proteinaceous/hemorrhagic debris).
Several other smaller T1 hypointense, T2 hyperintense, nonenhancing
lesions are also noted, compatible with simple cysts. Multiple T1
hyperintense, T2 hypointense, nonenhancing lesions are also noted in
the left kidney, compatible with proteinaceous/hemorrhagic cysts,
the largest of which measures 1.4 cm in the interpolar region. No
suspicious-appearing enhancing renal lesions are identified. No
hydroureteronephrosis in the visualized portions of the abdomen.
Bilateral adrenal glands are normal in appearance.

Stomach/Bowel: Visualized portions are unremarkable.

Vascular/Lymphatic: No aneurysm identified in the visualized
abdominal vasculature. No lymphadenopathy noted in the abdomen.

Other: No significant volume of ascites noted in the visualized
portions of the peritoneal cavity.

Musculoskeletal: No aggressive appearing osseous lesions are noted
in the visualized portions of the skeleton.
IMPRESSION: 1. Multiple simple and mildly complex (Bosniak class 2) cysts in the
kidneys bilaterally, similar to prior examination, as detailed
above.

## 2018-04-11 MED FILL — ESCITALOPRAM 10 MG TABLET: 10 | 90 days supply | Qty: 90 | Fill #1

## 2018-04-11 MED FILL — ROSUVASTATIN CALCIUM 5 MG T: 5 | 90 days supply | Qty: 45 | Fill #0

## 2018-07-12 MED FILL — ROSUVASTATIN CALCIUM 5 MG T: 5 | 90 days supply | Qty: 45 | Fill #1

## 2018-07-12 MED FILL — ESCITALOPRAM 10 MG TABLET: 10 | 90 days supply | Qty: 90 | Fill #2

## 2018-11-03 MED FILL — ROSUVASTATIN CALCIUM 5 MG T: 5 | 90 days supply | Qty: 45 | Fill #2

## 2018-11-03 MED FILL — ESCITALOPRAM 10 MG TABLET: 10 | 90 days supply | Qty: 90 | Fill #3

## 2018-12-23 ENCOUNTER — Telehealth: Payer: No Typology Code available for payment source | Admitting: Family

## 2018-12-23 DIAGNOSIS — B308 Other viral conjunctivitis: Secondary | ICD-10-CM

## 2018-12-23 MED ORDER — POLYMYXIN B-TRIMETHOPRIM 10000-0.1 UNIT/ML-% OP SOLN: 1.0000 [drp] | OPHTHALMIC | 0 refills | Status: DC

## 2018-12-23 NOTE — Progress Notes (Signed)
We are sorry that you are not feeling well.  Here is how we plan to help!  Based on what you have shared with me it looks like you have conjunctivitis.  Conjunctivitis is a common inflammatory or infectious condition of the eye that is often referred to as "pink eye".  In most cases it is contagious (viral or bacterial). However, not all conjunctivitis requires antibiotics (ex. Allergic).  We have made appropriate suggestions for you based upon your presentation.  I have prescribed Polytrim Ophthalmic drops 1-2 drops 4 times a day times 5 days  Pink eye can be highly contagious.  It is typically spread through direct contact with secretions, or contaminated objects or surfaces that one may have touched.  Strict handwashing is suggested with soap and water is urged.  If not available, use alcohol based had sanitizer.  Avoid unnecessary touching of the eye.  If you wear contact lenses, you will need to refrain from wearing them until you see no white discharge from the eye for at least 24 hours after being on medication.  You should see symptom improvement in 1-2 days after starting the medication regimen.  Call us if symptoms are not improved in 1-2 days.  Home Care:  Wash your hands often!  Do not wear your contacts until you complete your treatment plan.  Avoid sharing towels, bed linen, personal items with a person who has pink eye.  See attention for anyone in your home with similar symptoms.  Get Help Right Away If:  Your symptoms do not improve.  You develop blurred or loss of vision.  Your symptoms worsen (increased discharge, pain or redness)  Your e-visit answers were reviewed by a board certified advanced clinical practitioner to complete your personal care plan.  Depending on the condition, your plan could have included both over the counter or prescription medications.  If there is a problem please reply  once you have received a response from your provider.  Your safety is  important to us.  If you have drug allergies check your prescription carefully.    You can use MyChart to ask questions about today's visit, request a non-urgent call back, or ask for a work or school excuse for 24 hours related to this e-Visit. If it has been greater than 24 hours you will need to follow up with your provider, or enter a new e-Visit to address those concerns.   You will get an e-mail in the next two days asking about your experience.  I hope that your e-visit has been valuable and will speed your recovery. Thank you for using e-visits.  Greater than 5 minutes, yet less than 10 minutes of time have been spent researching, coordinating, and implementing care for this patient today.  Thank you for the details you included in the comment boxes. Those details are very helpful in determining the best course of treatment for you and help us to provide the best care.    

## 2019-02-07 ENCOUNTER — Other Ambulatory Visit: Payer: Self-pay | Admitting: Orthopedic Surgery

## 2019-02-08 ENCOUNTER — Other Ambulatory Visit: Payer: Self-pay | Admitting: Orthopedic Surgery

## 2019-02-08 MED FILL — ESCITALOPRAM 10 MG TABLET: 10 | 90 days supply | Qty: 90 | Fill #0

## 2019-02-26 ENCOUNTER — Other Ambulatory Visit: Payer: Self-pay

## 2019-02-26 ENCOUNTER — Encounter (HOSPITAL_BASED_OUTPATIENT_CLINIC_OR_DEPARTMENT_OTHER): Payer: Self-pay | Admitting: *Deleted

## 2019-02-27 MED FILL — ROSUVASTATIN CALCIUM 5 MG T: 5 | 90 days supply | Qty: 45 | Fill #3

## 2019-03-01 ENCOUNTER — Other Ambulatory Visit (HOSPITAL_COMMUNITY)
Admission: RE | Admit: 2019-03-01 | Discharge: 2019-03-01 | Disposition: A | Discharge status: Home / Self Care | Payer: No Typology Code available for payment source | Source: Ambulatory Visit | Attending: Orthopedic Surgery | Admitting: Orthopedic Surgery

## 2019-03-01 DIAGNOSIS — Z01812 Encounter for preprocedural laboratory examination: Secondary | ICD-10-CM | POA: Insufficient documentation

## 2019-03-01 DIAGNOSIS — Z20828 Contact with and (suspected) exposure to other viral communicable diseases: Secondary | ICD-10-CM | POA: Insufficient documentation

## 2019-03-01 NOTE — Progress Notes (Signed)

## 2019-03-02 LAB — NOVEL CORONAVIRUS, NAA (HOSP ORDER, SEND-OUT TO REF LAB; TAT 18-24 HRS): SARS-CoV-2, NAA: NOT DETECTED

## 2019-03-05 ENCOUNTER — Ambulatory Visit (HOSPITAL_BASED_OUTPATIENT_CLINIC_OR_DEPARTMENT_OTHER): Payer: No Typology Code available for payment source | Admitting: Anesthesiology

## 2019-03-05 ENCOUNTER — Encounter (HOSPITAL_BASED_OUTPATIENT_CLINIC_OR_DEPARTMENT_OTHER): Payer: Self-pay | Admitting: Emergency Medicine

## 2019-03-05 ENCOUNTER — Encounter (HOSPITAL_BASED_OUTPATIENT_CLINIC_OR_DEPARTMENT_OTHER)
Admission: RE | Disposition: A | Discharge status: Home / Self Care | Payer: Self-pay | Source: Home / Self Care | Attending: Orthopedic Surgery

## 2019-03-05 ENCOUNTER — Other Ambulatory Visit: Payer: Self-pay

## 2019-03-05 ENCOUNTER — Ambulatory Visit (HOSPITAL_BASED_OUTPATIENT_CLINIC_OR_DEPARTMENT_OTHER)
Admission: RE | Admit: 2019-03-05 | Discharge: 2019-03-05 | Disposition: A | Discharge status: Home / Self Care | Payer: No Typology Code available for payment source | Attending: Orthopedic Surgery | Admitting: Orthopedic Surgery

## 2019-03-05 DIAGNOSIS — F329 Major depressive disorder, single episode, unspecified: Secondary | ICD-10-CM | POA: Diagnosis not present

## 2019-03-05 DIAGNOSIS — E785 Hyperlipidemia, unspecified: Secondary | ICD-10-CM | POA: Diagnosis not present

## 2019-03-05 DIAGNOSIS — M21611 Bunion of right foot: Secondary | ICD-10-CM | POA: Insufficient documentation

## 2019-03-05 DIAGNOSIS — Z79899 Other long term (current) drug therapy: Secondary | ICD-10-CM | POA: Diagnosis not present

## 2019-03-05 DIAGNOSIS — M2011 Hallux valgus (acquired), right foot: Secondary | ICD-10-CM | POA: Insufficient documentation

## 2019-03-05 DIAGNOSIS — F419 Anxiety disorder, unspecified: Secondary | ICD-10-CM | POA: Insufficient documentation

## 2019-03-05 HISTORY — DX: Anxiety disorder, unspecified: F41.9

## 2019-03-05 HISTORY — DX: Hyperlipidemia, unspecified: E78.5

## 2019-03-05 HISTORY — DX: Depression, unspecified: F32.A

## 2019-03-05 HISTORY — PX: METATARSAL OSTEOTOMY WITH BUNIONECTOMY: SHX5662

## 2019-03-05 SURGERY — BUNIONECTOMY, WITH METATARSAL OSTEOTOMY
Anesthesia: Regional | Site: Foot | Laterality: Right

## 2019-03-05 MED ORDER — CLONIDINE HCL (ANALGESIA) 100 MCG/ML EP SOLN
EPIDURAL | Status: DC | PRN
  Administered 2019-03-05: 100 ug

## 2019-03-05 MED ORDER — PROPOFOL 10 MG/ML IV BOLUS
INTRAVENOUS | Status: DC | PRN
  Administered 2019-03-05: 20 mg via INTRAVENOUS
  Administered 2019-03-05: 30 mg via INTRAVENOUS

## 2019-03-05 MED ORDER — OXYCODONE-ACETAMINOPHEN 5-325 MG PO TABS: 1.0000 | ORAL_TABLET | Freq: Four times a day (QID) | ORAL | 0 refills | Status: DC | PRN

## 2019-03-05 MED ORDER — FENTANYL CITRATE (PF) 100 MCG/2ML IJ SOLN: 25.0000 ug | INTRAMUSCULAR | Status: DC | PRN

## 2019-03-05 MED ORDER — PROPOFOL 500 MG/50ML IV EMUL
INTRAVENOUS | Status: AC
  Filled 2019-03-05: qty 100

## 2019-03-05 MED ORDER — FENTANYL CITRATE (PF) 100 MCG/2ML IJ SOLN
INTRAMUSCULAR | Status: AC
  Filled 2019-03-05: qty 2

## 2019-03-05 MED ORDER — LIDOCAINE 2% (20 MG/ML) 5 ML SYRINGE
INTRAMUSCULAR | Status: AC
  Filled 2019-03-05: qty 5

## 2019-03-05 MED ORDER — ACETAMINOPHEN 500 MG PO TABS
ORAL_TABLET | ORAL | Status: AC
  Filled 2019-03-05: qty 2

## 2019-03-05 MED ORDER — LACTATED RINGERS IV SOLN
INTRAVENOUS | Status: DC
  Administered 2019-03-05 (×2): via INTRAVENOUS

## 2019-03-05 MED ORDER — PROPOFOL 500 MG/50ML IV EMUL
INTRAVENOUS | Status: DC | PRN
  Administered 2019-03-05: 100 ug/kg/min via INTRAVENOUS

## 2019-03-05 MED ORDER — BUPIVACAINE HCL (PF) 0.5 % IJ SOLN
INTRAMUSCULAR | Status: AC
  Filled 2019-03-05: qty 30

## 2019-03-05 MED ORDER — CHLORHEXIDINE GLUCONATE 4 % EX LIQD: 60.0000 mL | Freq: Once | CUTANEOUS | Status: DC

## 2019-03-05 MED ORDER — PHENYLEPHRINE 40 MCG/ML (10ML) SYRINGE FOR IV PUSH (FOR BLOOD PRESSURE SUPPORT)
PREFILLED_SYRINGE | INTRAVENOUS | Status: AC
  Filled 2019-03-05: qty 10

## 2019-03-05 MED ORDER — MIDAZOLAM HCL 2 MG/2ML IJ SOLN
INTRAMUSCULAR | Status: AC
  Filled 2019-03-05: qty 2

## 2019-03-05 MED ORDER — CEFAZOLIN SODIUM-DEXTROSE 2-4 GM/100ML-% IV SOLN
2.0000 g | INTRAVENOUS | Status: AC
  Administered 2019-03-05: 14:00:00 2 g via INTRAVENOUS

## 2019-03-05 MED ORDER — ROPIVACAINE HCL 5 MG/ML IJ SOLN
INTRAMUSCULAR | Status: DC | PRN
  Administered 2019-03-05: 25 mL via PERINEURAL

## 2019-03-05 MED ORDER — EPHEDRINE 5 MG/ML INJ
INTRAVENOUS | Status: AC
  Filled 2019-03-05: qty 10

## 2019-03-05 MED ORDER — OXYCODONE-ACETAMINOPHEN 5-325 MG PO TABS: 1.0000 | ORAL_TABLET | Freq: Four times a day (QID) | ORAL | 0 refills | Status: AC | PRN

## 2019-03-05 MED ORDER — ONDANSETRON HCL 4 MG/2ML IJ SOLN
INTRAMUSCULAR | Status: AC
  Filled 2019-03-05: qty 2

## 2019-03-05 MED ORDER — SUCCINYLCHOLINE CHLORIDE 200 MG/10ML IV SOSY
PREFILLED_SYRINGE | INTRAVENOUS | Status: AC
  Filled 2019-03-05: qty 10

## 2019-03-05 MED ORDER — FENTANYL CITRATE (PF) 100 MCG/2ML IJ SOLN
100.0000 ug | INTRAMUSCULAR | Status: DC | PRN
  Administered 2019-03-05 (×2): 50 ug via INTRAVENOUS

## 2019-03-05 MED ORDER — BUPIVACAINE HCL (PF) 0.25 % IJ SOLN
INTRAMUSCULAR | Status: AC
  Filled 2019-03-05: qty 30

## 2019-03-05 MED ORDER — MIDAZOLAM HCL 2 MG/2ML IJ SOLN
1.0000 mg | INTRAMUSCULAR | Status: DC | PRN
  Administered 2019-03-05: 14:00:00 1 mg via INTRAVENOUS
  Administered 2019-03-05: 2 mg via INTRAVENOUS
  Administered 2019-03-05: 1 mg via INTRAVENOUS

## 2019-03-05 MED ORDER — ACETAMINOPHEN 500 MG PO TABS
1000.0000 mg | ORAL_TABLET | Freq: Once | ORAL | Status: AC
  Administered 2019-03-05: 1000 mg via ORAL

## 2019-03-05 MED FILL — OXYCODONE-ACETAMINOPHEN 5-3: 5-325 | 5 days supply | Qty: 40 | Fill #0

## 2019-03-05 SURGICAL SUPPLY — 73 items
BLADE AVERAGE 25MMX9MM (BLADE)
BLADE AVERAGE 25X9 (BLADE) IMPLANT
BLADE CRESCENTIC 13.5X.38X32 (BLADE) ×1 IMPLANT
BLADE OSC/SAG .038X5.5 CUT EDG (BLADE) IMPLANT
BLADE SURG 15 STRL LF DISP TIS (BLADE) ×2 IMPLANT
BLADE SURG 15 STRL SS (BLADE) ×6
BNDG CMPR 9X4 STRL LF SNTH (GAUZE/BANDAGES/DRESSINGS) ×1
BNDG CONFORM 2 STRL LF (GAUZE/BANDAGES/DRESSINGS) ×3 IMPLANT
BNDG ELASTIC 2X5.8 VLCR STR LF (GAUZE/BANDAGES/DRESSINGS) ×6 IMPLANT
BNDG ESMARK 4X9 LF (GAUZE/BANDAGES/DRESSINGS) ×3 IMPLANT
CANISTER SUCT 1200ML W/VALVE (MISCELLANEOUS) IMPLANT
COVER BACK TABLE REUSABLE LG (DRAPES) ×3 IMPLANT
COVER WAND RF STERILE (DRAPES) IMPLANT
CUFF TOURN SGL QUICK 18X4 (TOURNIQUET CUFF) ×2 IMPLANT
CUFF TOURN SGL QUICK 24 (TOURNIQUET CUFF)
CUFF TRNQT CYL 24X4X16.5-23 (TOURNIQUET CUFF) IMPLANT
DECANTER SPIKE VIAL GLASS SM (MISCELLANEOUS) IMPLANT
DRAPE EXTREMITY T 121X128X90 (DISPOSABLE) ×3 IMPLANT
DRAPE IMP U-DRAPE 54X76 (DRAPES) ×3 IMPLANT
DRAPE OEC MINIVIEW 54X84 (DRAPES) ×3 IMPLANT
DRAPE U-SHAPE 47X51 STRL (DRAPES) ×3 IMPLANT
DRSG EMULSION OIL 3X3 NADH (GAUZE/BANDAGES/DRESSINGS) IMPLANT
DURAPREP 26ML APPLICATOR (WOUND CARE) ×3 IMPLANT
ELECT REM PT RETURN 9FT ADLT (ELECTROSURGICAL) ×3
ELECTRODE REM PT RTRN 9FT ADLT (ELECTROSURGICAL) ×1 IMPLANT
GAUZE SPONGE 4X4 12PLY STRL (GAUZE/BANDAGES/DRESSINGS) ×3 IMPLANT
GAUZE XEROFORM 1X8 LF (GAUZE/BANDAGES/DRESSINGS) IMPLANT
GLOVE BIOGEL PI IND STRL 7.0 (GLOVE) IMPLANT
GLOVE BIOGEL PI IND STRL 8 (GLOVE) ×2 IMPLANT
GLOVE BIOGEL PI INDICATOR 7.0 (GLOVE) ×2
GLOVE BIOGEL PI INDICATOR 8 (GLOVE) ×4
GLOVE ECLIPSE 6.5 STRL STRAW (GLOVE) ×2 IMPLANT
GLOVE ECLIPSE 7.5 STRL STRAW (GLOVE) ×6 IMPLANT
GOWN STRL REUS W/ TWL LRG LVL3 (GOWN DISPOSABLE) ×1 IMPLANT
GOWN STRL REUS W/ TWL XL LVL3 (GOWN DISPOSABLE) ×1 IMPLANT
GOWN STRL REUS W/TWL LRG LVL3 (GOWN DISPOSABLE) ×3
GOWN STRL REUS W/TWL XL LVL3 (GOWN DISPOSABLE) ×6 IMPLANT
K-WIRE .045X4 (WIRE) IMPLANT
KIT ORTHOSORB 1-PIN  2.0X40 (Joint) ×2 IMPLANT
KIT ORTHOSORB 1-PIN 2.0X40 (Joint) IMPLANT
NDL 1/2 CIR CATGUT .05X1.09 (NEEDLE) IMPLANT
NDL ADDISON D1/2 CIR (NEEDLE) IMPLANT
NDL HYPO 25X1 1.5 SAFETY (NEEDLE) IMPLANT
NEEDLE 1/2 CIR CATGUT .05X1.09 (NEEDLE) IMPLANT
NEEDLE ADDISON D1/2 CIR (NEEDLE) IMPLANT
NEEDLE HYPO 25X1 1.5 SAFETY (NEEDLE) IMPLANT
NS IRRIG 1000ML POUR BTL (IV SOLUTION) ×3 IMPLANT
PACK BASIN DAY SURGERY FS (CUSTOM PROCEDURE TRAY) ×3 IMPLANT
PAD CAST 3X4 CTTN HI CHSV (CAST SUPPLIES) IMPLANT
PADDING CAST ABS 4INX4YD NS (CAST SUPPLIES) ×2
PADDING CAST ABS COTTON 4X4 ST (CAST SUPPLIES) ×1 IMPLANT
PADDING CAST COTTON 3X4 STRL (CAST SUPPLIES)
PADDING UNDERCAST 2 STRL (CAST SUPPLIES) ×2
PADDING UNDERCAST 2X4 STRL (CAST SUPPLIES) ×1 IMPLANT
PENCIL BUTTON HOLSTER BLD 10FT (ELECTRODE) ×3 IMPLANT
SLEEVE SCD COMPRESS KNEE MED (MISCELLANEOUS) ×3 IMPLANT
STOCKINETTE 4X48 STRL (DRAPES) ×3 IMPLANT
SUCTION FRAZIER HANDLE 10FR (MISCELLANEOUS) ×2
SUCTION TUBE FRAZIER 10FR DISP (MISCELLANEOUS) ×1 IMPLANT
SUT ETHILON 3 0 PS 1 (SUTURE) IMPLANT
SUT ETHILON 4 0 PS 2 18 (SUTURE) IMPLANT
SUT FIBERWIRE 2-0 18 17.9 3/8 (SUTURE)
SUT FIBERWIRE 4-0 18 TAPR NDL (SUTURE) ×15
SUT VIC AB 2-0 PS2 27 (SUTURE) IMPLANT
SUT VIC AB 3-0 FS2 27 (SUTURE) IMPLANT
SUTURE FIBERWR 2-0 18 17.9 3/8 (SUTURE) IMPLANT
SUTURE FIBERWR 4-0 18 TAPR NDL (SUTURE) IMPLANT
SYR BULB 3OZ (MISCELLANEOUS) ×3 IMPLANT
SYR CONTROL 10ML LL (SYRINGE) IMPLANT
TOWEL GREEN STERILE FF (TOWEL DISPOSABLE) ×3 IMPLANT
TUBE CONNECTING 20'X1/4 (TUBING) ×1
TUBE CONNECTING 20X1/4 (TUBING) ×2 IMPLANT
UNDERPAD 30X36 HEAVY ABSORB (UNDERPADS AND DIAPERS) ×3 IMPLANT

## 2019-03-05 NOTE — Discharge Instructions (Signed)

## 2019-03-05 NOTE — Progress Notes (Signed)
Assisted Dr. Lanetta Inch with right, ultrasound guided, popliteal block. Side rails up, monitors on throughout procedure. See vital signs in flow sheet. Tolerated Procedure well.

## 2019-03-05 NOTE — Transfer of Care (Signed)
Immediate Anesthesia Transfer of Care Note  Patient: Barbara Greene  Procedure(s) Performed: CHEVRON OSTEOTOMY WITH BUNIONECTOMY (Right Foot)  Patient Location: PACU  Anesthesia Type:MAC combined with regional for post-op pain  Level of Consciousness: awake, alert  and oriented  Airway & Oxygen Therapy: Patient Spontanous Breathing and Patient connected to face mask oxygen  Post-op Assessment: Report given to RN and Post -op Vital signs reviewed and stable  Post vital signs: Reviewed and stable  Last Vitals:  Vitals Value Taken Time  BP    Temp    Pulse 72 03/05/19 1528  Resp 8 03/05/19 1528  SpO2 100 % 03/05/19 1528    Last Pain:  Vitals:   03/05/19 1255  TempSrc:   PainSc: 0-No pain         Complications: No apparent anesthesia complications

## 2019-03-05 NOTE — Anesthesia Preprocedure Evaluation (Addendum)
Anesthesia Evaluation  Patient identified by MRN, date of birth, ID band Patient awake    Reviewed: Allergy & Precautions, NPO status , Patient's Chart, lab work & pertinent test results  Airway Mallampati: I  TM Distance: >3 FB Neck ROM: Full    Dental no notable dental hx. (+) Teeth Intact, Dental Advisory Given   Pulmonary neg pulmonary ROS,    Pulmonary exam normal breath sounds clear to auscultation       Cardiovascular Normal cardiovascular exam Rhythm:Regular Rate:Normal  HLD   Neuro/Psych PSYCHIATRIC DISORDERS Anxiety Depression negative neurological ROS     GI/Hepatic negative GI ROS, Neg liver ROS,   Endo/Other  negative endocrine ROS  Renal/GU negative Renal ROS  negative genitourinary   Musculoskeletal negative musculoskeletal ROS (+)   Abdominal   Peds  Hematology negative hematology ROS (+)   Anesthesia Other Findings   Reproductive/Obstetrics                            Anesthesia Physical Anesthesia Plan  ASA: II  Anesthesia Plan: Regional and MAC   Post-op Pain Management:  Regional for Post-op pain   Induction: Intravenous  PONV Risk Score and Plan: 2 and Midazolam, Dexamethasone and Ondansetron  Airway Management Planned: Natural Airway and Simple Face Mask  Additional Equipment:   Intra-op Plan:   Post-operative Plan: Extubation in OR  Informed Consent: I have reviewed the patients History and Physical, chart, labs and discussed the procedure including the risks, benefits and alternatives for the proposed anesthesia with the patient or authorized representative who has indicated his/her understanding and acceptance.     Dental advisory given  Plan Discussed with: CRNA  Anesthesia Plan Comments:        Anesthesia Quick Evaluation

## 2019-03-05 NOTE — Anesthesia Postprocedure Evaluation (Signed)
Anesthesia Post Note  Patient: Barbara Greene  Procedure(s) Performed: CHEVRON OSTEOTOMY WITH BUNIONECTOMY (Right Foot)     Patient location during evaluation: PACU Anesthesia Type: Regional and MAC Level of consciousness: awake and alert Pain management: pain level controlled Vital Signs Assessment: post-procedure vital signs reviewed and stable Respiratory status: spontaneous breathing, nonlabored ventilation, respiratory function stable and patient connected to nasal cannula oxygen Cardiovascular status: stable and blood pressure returned to baseline Postop Assessment: no apparent nausea or vomiting Anesthetic complications: no    Last Vitals:  Vitals:   03/05/19 1530 03/05/19 1545  BP: (!) 110/55 132/67  Pulse: 71 77  Resp: 10 13  Temp:    SpO2: 100% 100%    Last Pain:  Vitals:   03/05/19 1545  TempSrc:   PainSc: 0-No pain                 Barnet Glasgow

## 2019-03-05 NOTE — Brief Op Note (Signed)
03/05/2019  3:11 PM  PATIENT:  Barbara Greene  63 y.o. female  PRE-OPERATIVE DIAGNOSIS:  PAINFUL BUNION RIGH FOOT W/HALLUX VALGUS DEFORMITY  POST-OPERATIVE DIAGNOSIS:  PAINFUL BUNION RIGH FOOT W/HALLUX VALGUS DEFORMITY  PROCEDURE:  Procedure(s): CHEVRON OSTEOTOMY WITH BUNIONECTOMY (Right)  SURGEON:  Surgeon(s) and Role:    Dorna Leitz, MD - Primary  PHYSICIAN ASSISTANT:   ASSISTANTS: jim bethune   ANESTHESIA:   general  EBL:  none   BLOOD ADMINISTERED:none  DRAINS: none   LOCAL MEDICATIONS USED:  MARCAINE     SPECIMEN:  No Specimen  DISPOSITION OF SPECIMEN:  N/A  COUNTS:  YES  TOURNIQUET:  * Missing tourniquet times found for documented tourniquets in log: [id number] *  DICTATION: .Other Dictation: Dictation Number [id number]  PLAN OF CARE: Discharge to home after PACU  PATIENT DISPOSITION:  PACU - hemodynamically stable.   Delay start of Pharmacological VTE agent (>24hrs) due to surgical blood loss or risk of bleeding: no

## 2019-03-05 NOTE — H&P (Signed)
A pre op hand p   Chief Complaint: Right foot pain  HPI: Barbara Greene is a 63 y.o. female who presents for evaluation of right foot pain. It has been present for greater than 3 months and has been worsening. She has failed conservative measures. Pain is rated as moderate.  The patient had previous left foot issues and did well with surgical intervention similar to what is proposed today  Past Medical History:  Diagnosis Date  . Anxiety   . Depression   . Hyperlipidemia    Past Surgical History:  Procedure Laterality Date  . BUNIONECTOMY WITH CHILECTOMY Left 03/23/2017   Procedure: CHEVRON OSTEOTOMY WITH BUNIONECTOMY;  Surgeon: Dorna Leitz, MD;  Location: [facility];  Service: Orthopedics;  Laterality: Left;  . CESAREAN SECTION    . COLONOSCOPY WITH PROPOFOL N/A 06/08/2016   Procedure: COLONOSCOPY WITH PROPOFOL;  Surgeon: Garlan Fair, MD;  Location: [facility];  Service: Endoscopy;  Laterality: N/A;   Social History   Socioeconomic History  . Marital status: Married    Spouse name: Not on file  . Number of children: Not on file  . Years of education: Not on file  . Highest education level: Not on file  Occupational History  . Not on file  Social Needs  . Financial resource strain: Not on file  . Food insecurity    Worry: Not on file    Inability: Not on file  . Transportation needs    Medical: Not on file    Non-medical: Not on file  Tobacco Use  . Smoking status: Never Smoker  . Smokeless tobacco: Never Used  Substance and Sexual Activity  . Alcohol use: Yes    Comment: occasional   . Drug use: No  . Sexual activity: Not on file  Lifestyle  . Physical activity    Days per week: Not on file    Minutes per session: Not on file  . Stress: Not on file  Relationships  . Social Herbalist on phone: Not on file    Gets together: Not on file    Attends religious service: Not on file    Active member of club or organization: Not on  file    Attends meetings of clubs or organizations: Not on file    Relationship status: Not on file  Other Topics Concern  . Not on file  Social History Narrative  . Not on file   History reviewed. No pertinent family history. No Known Allergies Prior to Admission medications   Medication Sig Start Date End Date Taking? Authorizing Provider  calcium carbonate (OSCAL) 1500 (600 Ca) MG TABS tablet Take by mouth daily with breakfast.   Yes [provider]  escitalopram (LEXAPRO) 10 MG tablet Take 10 mg daily by mouth.   Yes [provider]  rosuvastatin (CRESTOR) 40 MG tablet Take 40 mg by mouth daily. Every other day   Yes [provider]     Positive ROS: None  All other systems have been reviewed and were otherwise negative with the exception of those mentioned in the HPI and as above.  Physical Exam: Vitals:   03/05/19 1142  BP: (!) 141/73  Pulse: 87  Resp: 14  Temp: 98.5 F (36.9 C)  SpO2: 100%    General: Alert, no acute distress Cardiovascular: No pedal edema Respiratory: No cyanosis, no use of accessory musculature GI: No organomegaly, abdomen is soft and non-tender Skin: No lesions in the  area of chief complaint Neurologic: Sensation intact distally Psychiatric: Patient is competent for consent with normal mood and affect Lymphatic: No axillary or cervical lymphadenopathy  MUSCULOSKELETAL: Right foot: Obvious bunion deformity.  Tenderness to palpation.  Pain with range of motion.  No instability.  X-ray: X-ray shows moderate bunion deformity with 13 degree intermetatarsal angle  Assessment/Plan: PAINFUL BUNION RIGH FOOT W/HALLUX VALGUS DEFORMITY Plan for Procedure(s): CHEVRON OSTEOTOMY WITH BUNIONECTOMY  The risks benefits and alternatives were discussed with the patient including but not limited to the risks of nonoperative treatment, versus surgical intervention including infection, bleeding, nerve injury, malunion, nonunion,  hardware prominence, hardware failure, need for hardware removal, blood clots, cardiopulmonary complications, morbidity, mortality, among others, and they were willing to proceed.  Predicted outcome is good, although there will be at least a six to nine month expected recovery.  Alta Corning, MD 03/05/2019 12:22 PM

## 2019-03-05 NOTE — Anesthesia Procedure Notes (Signed)
Anesthesia Regional Block: Popliteal block   Pre-Anesthetic Checklist: ,, timeout performed, Correct Patient, Correct Site, Correct Laterality, Correct Procedure, Correct Position, site marked, Risks and benefits discussed,  Surgical consent,  Pre-op evaluation,  At surgeon's request and post-op pain management  Laterality: Right  Prep: Maximum Sterile Barrier Precautions used, chloraprep       Needles:  Injection technique: Single-shot  Needle Type: Echogenic Stimulator Needle     Needle Length: 9cm  Needle Gauge: 22     Additional Needles:   Procedures:,,,, ultrasound used (permanent image in chart),,,,  Narrative:  Start time: 03/05/2019 12:44 PM End time: 03/05/2019 12:54 PM Injection made incrementally with aspirations every 5 mL.  Performed by: Personally  Anesthesiologist: Freddrick March, MD  Additional Notes: Monitors applied. No increased pain on injection. No increased resistance to injection. Injection made in 5cc increments. Good needle visualization. Patient tolerated procedure well.

## 2019-03-06 ENCOUNTER — Encounter (HOSPITAL_BASED_OUTPATIENT_CLINIC_OR_DEPARTMENT_OTHER): Payer: Self-pay | Admitting: Orthopedic Surgery

## 2019-03-06 NOTE — Op Note (Signed)
NAME: CLOTENE, HISER MEDICAL RECORD [id number] ACCOUNT [id number] DATE OF BIRTH:06/25/1955 FACILITY: [facility] LOCATION: [facility] PHYSICIAN:Dolce Sylvia L. 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  PROCEDURE: 1.  Distal chevron osteotomy with a capsular repair and closure. 2.  1:2 interspace tenotomy of the adductor(flexor tendon of the foot) tendon with waffling of the capsule. 3.  Interpretation of multiple intraoperative fluoroscopic images.  SURGEON:  Dorna Leitz, MD  ASSISTANT:  Gaspar Skeeters PA-C, was present for the entire case and assisted by manipulation of tissues, manipulation of the toe, and closing to minimize OR time.  BRIEF HISTORY:  The patient is a 63 year old female with a long history of significant complaints of bilateral bunions with large hallux valgus angles.  She had the left side done a year or more ago and was very satisfied with that.  She was having pain,  difficulty with shoe wear and because of failure of conservative care, she was taken to the operating room for right fixation.  DESCRIPTION OF PROCEDURE:  The patient brought to the operating room after adequate anesthesia was obtained with a general anesthetic, the patient was taken to the operating table.  The right leg was prepped and draped in usual sterile fashion.   Following this, the leg was exsanguinated, blood pressure tourniquet inflated to 250 mmHg.  Following this, an incision was made in the 1:2 interspace.  Subcutaneous tissue was dissected down to the level of the adductor tendon.  The adductor tendon and  its insertion right on the proximal phalanx of the great toe was identified and a sharp knife was used to release it from the fibular sesamoid as well as from the capsule.  It turns into a dynamic  structure and it is tagged.  Attention was then turned to  the toe, which was held in an overcorrected position while the capsule is waffled multiple times.  With waffling, the toe is now easily passively correctable to neutral.  At this time, attention is turned towards taking a FiberWire suture.  I passed a  limb into the head of the 2nd metatarsal capsule through the adductor tendon and through the head of the great metatarsal on that fibular side.  This was tagged with a hemostat and a 2nd suture was passed in a similar fashion and tagged.  Attention was  then turned over to the medial eminence where an incision was made down the length of the medial eminence subcutaneous.  Taken down to the level of the capsule, care being taken to identify and protect the sensory branches of the nerve that are there.   Once this was done, horizontal limbs of a capsulotomy are undertaken about 4 mm apart given the large nature of the hallux valgus angle that she had.  This is resected and then attention is turned towards removal of the medial eminence.  A saw was used  to remove the medial eminence in line with the shaft.  Once this was done, the marking pen is used to draw out the chevron osteotomy and then the chevron osteotomy is performed with a saw and then the head is moved over to become parallel with the 2nd  metatarsal.  Fluoroscopy was used at this point to make sure that we have got an adequate positioning of the metatarsal.  We then take  a 2 mm bio pin and advanced a guidewire into the head under fluoroscopic guidance and once we had this done and held in  place we then advanced a bio pin across the same area.  Once the bio pin was in place excellent fixation was achieved.  I then went back and re took off some of the metatarsal shaft to get back to neutral on the lateral side.  At this point, some images  were taken to make sure that we were adequately reduced and when we were, I then took 3 interrupted 4-0  FiberWire sutures in a pants-over-vest type suture pattern holding the toe in a slightly overcorrected position and when they were all held, the toe  was held in a very nice slightly overcorrected position.  I tied all 3 of these sutures.  I then used a running suture there and also a running suture to reattach the top of the limb back to the metatarsal remnant.  Once that was done, we had an  excellent correction.  I went into the 1:2 interspace and took the 2 sutures that had previously been passed and tied them independently, locking the adductor in between the 1st and 2nd metatarsal heads.  Wounds were irrigated, suctioned dry and closed  with nylon interrupted sutures at this point.  Once we were satisfied with this, we then put her into a dressing that held the toe in a slightly corrected position and a sterile compressive dressing had been applied.  The patient was now taken to  recovery room where she was noted to be in satisfactory condition in a hard sole shoe.  CN/NUANCE  D:03/05/2019 T:03/06/2019 JOB:[id number]

## 2019-05-24 MED FILL — ESCITALOPRAM 10 MG TABLET: 10 | 90 days supply | Qty: 90 | Fill #1

## 2019-09-18 MED FILL — ESCITALOPRAM 10 MG TABLET: 10 | 90 days supply | Qty: 90 | Fill #2

## 2019-10-04 ENCOUNTER — Other Ambulatory Visit: Payer: Self-pay

## 2019-10-04 ENCOUNTER — Encounter (INDEPENDENT_AMBULATORY_CARE_PROVIDER_SITE_OTHER): Payer: Self-pay | Admitting: Ophthalmology

## 2019-10-04 ENCOUNTER — Ambulatory Visit (INDEPENDENT_AMBULATORY_CARE_PROVIDER_SITE_OTHER): Payer: No Typology Code available for payment source | Admitting: Ophthalmology

## 2019-10-04 DIAGNOSIS — H35412 Lattice degeneration of retina, left eye: Secondary | ICD-10-CM | POA: Diagnosis not present

## 2019-10-04 DIAGNOSIS — H35411 Lattice degeneration of retina, right eye: Secondary | ICD-10-CM | POA: Diagnosis not present

## 2019-10-04 DIAGNOSIS — H2513 Age-related nuclear cataract, bilateral: Secondary | ICD-10-CM | POA: Diagnosis not present

## 2019-10-04 DIAGNOSIS — D3131 Benign neoplasm of right choroid: Secondary | ICD-10-CM

## 2019-10-04 NOTE — Assessment & Plan Note (Signed)
The nature of choroidal nevus was discussed with the patient and an informational form was offered.  Photo documentation was discussed with the patient.  Periodic follow-up may be needed for a lifetime. The patient's questions were answered. At minimum, annual exams will be needed if no signs of early growth. 

## 2019-10-04 NOTE — Progress Notes (Signed)
10/04/2019     CHIEF COMPLAINT Patient presents for Retina Follow Up   HISTORY OF PRESENT ILLNESS: Barbara Greene is a 64 y.o. female who presents to the clinic today for:   HPI    Retina Follow Up    Patient presents with  Other.  In both eyes.  Severity is moderate.  Duration of 1.  Since onset it is stable.  I, the attending physician,  performed the HPI with the patient and updated documentation appropriately.          Comments    1 Year f\u OU. FP and B Scan  Pt states vision is stable. Denies FOL and floaters.       Last edited by Tilda Franco on 10/04/2019  3:51 PM. (History)      Referring physician: Lavone Orn, MD [place]. [place],   [address]  HISTORICAL INFORMATION:   Selected notes from the [facility]: No current outpatient medications on file. (Ophthalmic Drugs)   No current facility-administered medications for this visit. (Ophthalmic Drugs)   Current Outpatient Medications (Other)  Medication Sig  . calcium carbonate (OSCAL) 1500 (600 Ca) MG TABS tablet Take by mouth daily with breakfast.  . escitalopram (LEXAPRO) 10 MG tablet Take 10 mg daily by mouth.  . oxyCODONE-acetaminophen (PERCOCET/ROXICET) 5-325 MG tablet Take 1-2 tablets by mouth every 6 (six) hours as needed for severe pain.  Marland Kitchen oxyCODONE-acetaminophen (PERCOCET/ROXICET) 5-325 MG tablet Take 1-2 tablets by mouth every 6 (six) hours as needed for severe pain.  . rosuvastatin (CRESTOR) 40 MG tablet Take 40 mg by mouth daily. Every other day   No current facility-administered medications for this visit. (Other)      REVIEW OF SYSTEMS:    ALLERGIES No Known Allergies  PAST MEDICAL HISTORY Past Medical History:  Diagnosis Date  . Anxiety   . Depression   . Hyperlipidemia    Past Surgical History:  Procedure Laterality Date  . BUNIONECTOMY WITH CHILECTOMY Left 03/23/2017   Procedure: CHEVRON OSTEOTOMY  WITH BUNIONECTOMY;  Surgeon: Dorna Leitz, MD;  Location: [facility];  Service: Orthopedics;  Laterality: Left;  . CESAREAN SECTION    . COLONOSCOPY WITH PROPOFOL N/A 06/08/2016   Procedure: COLONOSCOPY WITH PROPOFOL;  Surgeon: Garlan Fair, MD;  Location: [facility];  Service: Endoscopy;  Laterality: N/A;  . METATARSAL OSTEOTOMY WITH BUNIONECTOMY Right 03/05/2019   Procedure: CHEVRON OSTEOTOMY WITH BUNIONECTOMY;  Surgeon: Dorna Leitz, MD;  Location: [facility];  Service: Orthopedics;  Laterality: Right;    FAMILY HISTORY History reviewed. No pertinent family history.  SOCIAL HISTORY Social History   Tobacco Use  . Smoking status: Never Smoker  . Smokeless tobacco: Never Used  Vaping Use  . Vaping Use: Never used  Substance Use Topics  . Alcohol use: Yes    Comment: occasional   . Drug use: No         OPHTHALMIC EXAM: Base Eye Exam    Visual Acuity (Snellen - Linear)      Right Left   Dist cc 20/20 20/25 -1   Correction: Glasses       Tonometry (Tonopen, 3:54 PM)      Right Left   Pressure 9 7       Pupils      Pupils Dark Light Shape React APD   Right PERRL 4 3 Round Brisk None   Left PERRL  4 3 Round Brisk None       Visual Fields (Counting fingers)      Left Right    Full Full       Neuro/Psych    Oriented x3: Yes   Mood/Affect: Normal       Dilation    Both eyes: 1.0% Mydriacyl, 2.5% Phenylephrine @ 3:54 PM        Slit Lamp and Fundus Exam    External Exam      Right Left   External Normal Normal       Slit Lamp Exam      Right Left   Lens Nuclear sclerosis Nuclear sclerosis          IMAGING AND PROCEDURES  Imaging and Procedures for 10/04/19           ASSESSMENT/PLAN:  No problem-specific Assessment & Plan notes found for this encounter.      ICD-10-CM   1. 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 Nuclear sclerotic cataract of both eyes  H25.13     1.  2.  3.  Ophthalmic Meds Ordered this visit:  No orders of the defined types were placed in this encounter.      No follow-ups on file.  There are no Patient Instructions on file for this visit.   Explained the diagnoses, plan, and follow up with the patient and they expressed understanding.  Patient expressed understanding of the importance of proper follow up care.   Clent Demark Traci Gafford M.D. Diseases & Surgery of the Retina and Vitreous Retina & Diabetic [place] 10/04/19     Abbreviations: M myopia (nearsighted); A astigmatism; H hyperopia (farsighted); P presbyopia; Mrx spectacle prescription;  CTL contact lenses; OD right eye; OS left eye; OU both eyes  XT exotropia; ET esotropia; PEK punctate epithelial keratitis; PEE punctate epithelial erosions; DES dry eye syndrome; MGD meibomian gland dysfunction; ATs artificial tears; PFAT's preservative free artificial tears; [place] nuclear sclerotic cataract; PSC posterior subcapsular cataract; ERM epi-retinal membrane; PVD posterior vitreous detachment; RD retinal detachment; DM diabetes mellitus; DR diabetic retinopathy; NPDR non-proliferative diabetic retinopathy; PDR proliferative diabetic retinopathy; CSME clinically significant macular edema; DME diabetic macular edema; dbh dot blot hemorrhages; CWS cotton wool spot; POAG primary open angle glaucoma; C/D cup-to-disc ratio; HVF humphrey visual field; GVF goldmann visual field; OCT optical coherence tomography; IOP intraocular pressure; BRVO Branch retinal vein occlusion; CRVO central retinal vein occlusion; CRAO central retinal artery occlusion; BRAO branch retinal artery occlusion; RT retinal tear; SB scleral buckle; PPV pars plana vitrectomy; VH Vitreous hemorrhage; PRP panretinal laser photocoagulation; IVK intravitreal kenalog; VMT vitreomacular traction; MH Macular hole;  NVD  neovascularization of the disc; NVE neovascularization elsewhere; AREDS age related eye disease study; ARMD age related macular degeneration; POAG primary open angle glaucoma; EBMD epithelial/anterior basement membrane dystrophy; ACIOL anterior chamber intraocular lens; IOL intraocular lens; PCIOL posterior chamber intraocular lens; Phaco/IOL phacoemulsification with intraocular lens placement; [place] photorefractive keratectomy; LASIK laser assisted in situ keratomileusis; HTN hypertension; DM diabetes mellitus; COPD chronic obstructive pulmonary disease

## 2019-12-21 ENCOUNTER — Other Ambulatory Visit (HOSPITAL_COMMUNITY): Payer: Self-pay | Admitting: Internal Medicine

## 2019-12-21 MED FILL — ESCITALOPRAM 10 MG TABLET: 10 | 90 days supply | Qty: 90 | Fill #0

## 2020-01-22 MED FILL — ROSUVASTATIN CALCIUM 5 MG T: 5 | 90 days supply | Qty: 45 | Fill #0

## 2020-03-05 ENCOUNTER — Other Ambulatory Visit (HOSPITAL_COMMUNITY): Payer: Self-pay | Admitting: Internal Medicine

## 2020-03-05 MED FILL — ROSUVASTATIN CALCIUM 5 MG T: 5 | 90 days supply | Qty: 90 | Fill #0

## 2020-04-02 MED FILL — ESCITALOPRAM 10 MG TABLET: 10 | 90 days supply | Qty: 90 | Fill #1

## 2020-04-02 MED FILL — ROSUVASTATIN CALCIUM 5 MG T: 5 | 90 days supply | Qty: 90 | Fill #0

## 2020-05-12 ENCOUNTER — Other Ambulatory Visit (HOSPITAL_COMMUNITY): Payer: Self-pay | Admitting: Internal Medicine

## 2020-05-12 MED FILL — ROSUVASTATIN CALCIUM 10 MG: 10 | 90 days supply | Qty: 90 | Fill #0

## 2020-06-06 MED FILL — ROSUVASTATIN CALCIUM 10 MG: 10 | 90 days supply | Qty: 90 | Fill #0

## 2020-07-16 MED FILL — ESCITALOPRAM 10 MG TABLET: 10 | 90 days supply | Qty: 90 | Fill #2

## 2020-09-11 ENCOUNTER — Other Ambulatory Visit (HOSPITAL_COMMUNITY): Payer: Self-pay

## 2020-09-12 ENCOUNTER — Other Ambulatory Visit (HOSPITAL_COMMUNITY): Payer: Self-pay

## 2020-09-12 MED ORDER — ROSUVASTATIN CALCIUM 10 MG PO TABS
10.0000 mg | ORAL_TABLET | Freq: Every day | ORAL | 4 refills | Status: DC
  Filled 2020-09-12: qty 90, 90d supply, fill #0
  Filled 2020-12-10: qty 90, 90d supply, fill #1
  Filled 2021-03-17: qty 90, 90d supply, fill #2
  Filled 2021-06-24: qty 90, 90d supply, fill #3

## 2020-10-06 ENCOUNTER — Ambulatory Visit (INDEPENDENT_AMBULATORY_CARE_PROVIDER_SITE_OTHER): Payer: No Typology Code available for payment source | Admitting: Ophthalmology

## 2020-10-06 ENCOUNTER — Other Ambulatory Visit: Payer: Self-pay

## 2020-10-06 ENCOUNTER — Encounter (INDEPENDENT_AMBULATORY_CARE_PROVIDER_SITE_OTHER): Payer: Self-pay | Admitting: Ophthalmology

## 2020-10-06 DIAGNOSIS — D3131 Benign neoplasm of right choroid: Secondary | ICD-10-CM

## 2020-10-06 NOTE — Progress Notes (Signed)
10/06/2020     CHIEF COMPLAINT Patient presents for Retina Follow Up (1 Year F/U OU//Pt denies noticeable changes to [place] OU since last visit. Pt denies ocular pain, flashes of light, or floaters OU. //)   HISTORY OF PRESENT ILLNESS: Barbara Greene is a 65 y.o. female who presents to the clinic today for:   HPI     Retina Follow Up           Diagnosis: Other   Laterality: right eye   Onset: 1 year ago   Severity: mild   Duration: 1 year   Course: stable   Comments: 1 Year F/U OU  Pt denies noticeable changes to [place] OU since last visit. Pt denies ocular pain, flashes of light, or floaters OU.          Last edited by Rockie Neighbours, [place] on 10/06/2020  2:18 PM.      Referring physician: Lavone Orn, MD [address],  [place] [address]  HISTORICAL INFORMATION:   Selected notes from the [facility]: No current outpatient medications on file. (Ophthalmic Drugs)   No current facility-administered medications for this visit. (Ophthalmic Drugs)   Current Outpatient Medications (Other)  Medication Sig   calcium carbonate (OSCAL) 1500 (600 Ca) MG TABS tablet Take by mouth daily with breakfast.   escitalopram (LEXAPRO) 10 MG tablet Take 10 mg daily by mouth.   escitalopram (LEXAPRO) 10 MG tablet TAKE 1 TABLET BY MOUTH ONCE DAILY   oxyCODONE-acetaminophen (PERCOCET/ROXICET) 5-325 MG tablet Take 1-2 tablets by mouth every 6 (six) hours as needed for severe pain.   oxyCODONE-acetaminophen (PERCOCET/ROXICET) 5-325 MG tablet Take 1-2 tablets by mouth every 6 (six) hours as needed for severe pain.   rosuvastatin (CRESTOR) 10 MG tablet Take 1 tablet (10 mg total) by mouth daily.   rosuvastatin (CRESTOR) 40 MG tablet Take 40 mg by mouth daily. Every other day   No current facility-administered medications for this visit. (Other)      REVIEW OF SYSTEMS:    ALLERGIES No Known Allergies  PAST MEDICAL  HISTORY Past Medical History:  Diagnosis Date   Anxiety    Depression    Hyperlipidemia    Past Surgical History:  Procedure Laterality Date   BUNIONECTOMY WITH CHILECTOMY Left 03/23/2017   Procedure: CHEVRON OSTEOTOMY WITH BUNIONECTOMY;  Surgeon: Barbara Leitz, MD;  Location: [facility];  Service: Orthopedics;  Laterality: Left;   CESAREAN SECTION     COLONOSCOPY WITH PROPOFOL N/A 06/08/2016   Procedure: COLONOSCOPY WITH PROPOFOL;  Surgeon: Barbara Fair, MD;  Location: [facility];  Service: Endoscopy;  Laterality: N/A;   METATARSAL OSTEOTOMY WITH BUNIONECTOMY Right 03/05/2019   Procedure: CHEVRON OSTEOTOMY WITH BUNIONECTOMY;  Surgeon: Barbara Leitz, MD;  Location: [facility];  Service: Orthopedics;  Laterality: Right;    FAMILY HISTORY History reviewed. No pertinent family history.  SOCIAL HISTORY Social History   Tobacco Use   Smoking status: Never   Smokeless tobacco: Never  Vaping Use   Vaping Use: Never used  Substance Use Topics   Alcohol use: Yes    Comment: occasional    Drug use: No         OPHTHALMIC EXAM:  Base Eye Exam     Visual Acuity (ETDRS)       Right Left   Dist cc 20/20 20/25    Correction: Glasses  Tonometry (Tonopen, 2:21 PM)       Right Left   Pressure 15 13         Pupils       Dark Light Shape React APD   Right 6 5 Round Brisk None   Left 7 6 Round Brisk None         Visual Fields (Counting fingers)       Left Right    Full Full         Extraocular Movement       Right Left    Full Full         Neuro/Psych     Oriented x3: Yes   Mood/Affect: Normal         Dilation     Both eyes: 1.0% Mydriacyl, 2.5% Phenylephrine @ 2:21 PM           Slit Lamp and Fundus Exam     External Exam       Right Left   External Normal Normal         Slit Lamp Exam       Right Left   Lids/Lashes Normal Normal   Conjunctiva/Sclera White and quiet White and quiet    Cornea Clear Clear   Anterior Chamber Deep and quiet Deep and quiet   Iris Round and reactive Round and reactive   Lens Nuclear sclerosis Nuclear sclerosis   Anterior Vitreous Normal Normal         Fundus Exam       Right Left   Posterior Vitreous Normal Normal   Disc Normal Normal   C/D Ratio 0.35 0.3   Macula Normal Normal   Vessels Normal Normal   Periphery Temporal nevus 3.5x2.5DD, flat,, no orange lipofuscin, no fluid no apparent thickness regularly pigmented Lattice degeneration, 5-6 o'clock no holes            IMAGING AND PROCEDURES  Imaging and Procedures for 10/06/20  Color Fundus Photography Optos - OU - Both Eyes       Right Eye Progression has been stable. Disc findings include normal observations. Vessels : normal observations.   Left Eye Progression has been stable. Disc findings include normal observations. Macula : normal observations. Vessels : normal observations. Periphery : normal observations.   Notes Choroidal nevus temporal macula no interval change over the ensuing 6 years.     B-Scan Ultrasound - OD - Right Eye       Quality was good. Lesion size was unchanged.   Notes Choroidal nevus temporal macula, flat by B-scan and A-scan combo.  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  No high risk features.  No lipofuscin, no fluid, no atrophy, no thickness, no change in size     ICD-10-CM   1. 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  No interval change OD, no high risk features,  2.  No appreciable thickness on clinical examination with 20 diopter examination    3.  We will 0.96 thickness by B-scan/A-scan ultrasonography  Ophthalmic Meds Ordered this visit:  No orders of  the defined types were placed in this encounter.  Return in about 1 year (around 10/06/2021) for DILATE OU, COLOR FP, B-Scan U/S, OD.  There are no Patient Instructions on file for this visit.   Explained the diagnoses, plan, and follow up with the patient and they expressed understanding.  Patient expressed understanding of the importance of proper follow up care.   Barbara Greene M.D. Diseases & Surgery of the Retina and Vitreous Retina & Diabetic [place] 10/06/20     Abbreviations: M myopia (nearsighted); A astigmatism; H hyperopia (farsighted); P presbyopia; Mrx spectacle prescription;  CTL contact lenses; OD right eye; OS left eye; OU both eyes  XT exotropia; ET esotropia; PEK punctate epithelial keratitis; PEE punctate epithelial erosions; DES dry eye syndrome; MGD meibomian gland dysfunction; ATs artificial tears; PFAT's preservative free artificial tears; [place] nuclear sclerotic cataract; PSC posterior subcapsular cataract; ERM epi-retinal membrane; PVD posterior vitreous detachment; RD retinal detachment; DM diabetes mellitus; DR diabetic retinopathy; NPDR non-proliferative diabetic retinopathy; PDR proliferative diabetic retinopathy; CSME clinically significant macular edema; DME diabetic macular edema; dbh dot blot hemorrhages; CWS cotton wool spot; POAG primary open angle glaucoma; C/D cup-to-disc ratio; HVF humphrey visual field; GVF goldmann visual field; OCT optical coherence tomography; IOP intraocular pressure; BRVO Branch retinal vein occlusion; CRVO central retinal vein occlusion; CRAO central retinal artery occlusion; BRAO branch retinal artery occlusion; RT retinal tear; SB scleral buckle; PPV pars plana vitrectomy; VH Vitreous hemorrhage; PRP panretinal laser photocoagulation; IVK intravitreal kenalog; VMT vitreomacular traction; MH Macular hole;  NVD neovascularization of the disc; NVE neovascularization elsewhere; AREDS age related eye disease study; ARMD age related  macular degeneration; POAG primary open angle glaucoma; EBMD epithelial/anterior basement membrane dystrophy; ACIOL anterior chamber intraocular lens; IOL intraocular lens; PCIOL posterior chamber intraocular lens; Phaco/IOL phacoemulsification with intraocular lens placement; [place] photorefractive keratectomy; LASIK laser assisted in situ keratomileusis; HTN hypertension; DM diabetes mellitus; COPD chronic obstructive pulmonary disease

## 2020-10-06 NOTE — Assessment & Plan Note (Signed)
Choroidal nevus temporal macula, no interval change over the last 6 years.  No high risk features.  No lipofuscin, no fluid, no atrophy, no thickness, no change in size

## 2020-10-23 ENCOUNTER — Other Ambulatory Visit (HOSPITAL_COMMUNITY): Payer: Self-pay

## 2020-10-23 MED FILL — Escitalopram Oxalate Tab 10 MG (Base Equiv): ORAL | 90 days supply | Qty: 90 | Fill #0 | Status: AC

## 2020-12-10 ENCOUNTER — Other Ambulatory Visit (HOSPITAL_COMMUNITY): Payer: Self-pay

## 2021-02-04 ENCOUNTER — Other Ambulatory Visit (HOSPITAL_COMMUNITY): Payer: Self-pay

## 2021-02-04 MED ORDER — ESCITALOPRAM OXALATE 10 MG PO TABS
10.0000 mg | ORAL_TABLET | Freq: Every day | ORAL | 3 refills | Status: AC
  Filled 2021-02-04: qty 90, 90d supply, fill #0
  Filled 2021-05-12: qty 90, 90d supply, fill #1
  Filled 2021-08-25: qty 90, 90d supply, fill #2
  Filled 2021-11-27: qty 90, 90d supply, fill #3

## 2021-03-17 ENCOUNTER — Other Ambulatory Visit (HOSPITAL_COMMUNITY): Payer: Self-pay

## 2021-05-12 ENCOUNTER — Other Ambulatory Visit (HOSPITAL_COMMUNITY): Payer: Self-pay

## 2021-06-24 ENCOUNTER — Other Ambulatory Visit (HOSPITAL_COMMUNITY): Payer: Self-pay

## 2021-08-10 ENCOUNTER — Other Ambulatory Visit (HOSPITAL_COMMUNITY): Payer: Self-pay

## 2021-08-10 MED ORDER — DIAZEPAM 5 MG PO TABS
5.0000 mg | ORAL_TABLET | Freq: Every evening | ORAL | 0 refills | Status: DC
  Filled 2021-08-10: qty 4, 2d supply, fill #0

## 2021-08-10 MED ORDER — AMOXICILLIN 500 MG PO CAPS
1000.0000 mg | ORAL_CAPSULE | ORAL | 1 refills | Status: DC
  Filled 2021-08-10: qty 21, 7d supply, fill #0

## 2021-08-14 ENCOUNTER — Other Ambulatory Visit (HOSPITAL_COMMUNITY): Payer: Self-pay

## 2021-08-18 ENCOUNTER — Other Ambulatory Visit (HOSPITAL_COMMUNITY): Payer: Self-pay

## 2021-08-18 MED ORDER — NA SULFATE-K SULFATE-MG SULF 17.5-3.13-1.6 GM/177ML PO SOLN
ORAL | 0 refills | Status: DC
  Filled 2021-08-18: qty 354, 1d supply, fill #0

## 2021-08-19 ENCOUNTER — Other Ambulatory Visit (HOSPITAL_COMMUNITY): Payer: Self-pay

## 2021-08-25 ENCOUNTER — Other Ambulatory Visit (HOSPITAL_COMMUNITY): Payer: Self-pay

## 2021-10-06 ENCOUNTER — Other Ambulatory Visit (HOSPITAL_COMMUNITY): Payer: Self-pay

## 2021-10-06 MED ORDER — ROSUVASTATIN CALCIUM 10 MG PO TABS
10.0000 mg | ORAL_TABLET | Freq: Every day | ORAL | 3 refills | Status: DC
  Filled 2021-10-06: qty 90, 90d supply, fill #0
  Filled 2022-01-08: qty 90, 90d supply, fill #1
  Filled 2022-04-16: qty 90, 90d supply, fill #2
  Filled 2022-07-22 (×2): qty 90, 90d supply, fill #3

## 2021-10-12 ENCOUNTER — Encounter (INDEPENDENT_AMBULATORY_CARE_PROVIDER_SITE_OTHER): Payer: No Typology Code available for payment source | Admitting: Ophthalmology

## 2021-10-12 ENCOUNTER — Encounter (INDEPENDENT_AMBULATORY_CARE_PROVIDER_SITE_OTHER): Payer: Self-pay | Admitting: Ophthalmology

## 2021-10-12 ENCOUNTER — Ambulatory Visit (INDEPENDENT_AMBULATORY_CARE_PROVIDER_SITE_OTHER): Payer: No Typology Code available for payment source | Admitting: Ophthalmology

## 2021-10-12 DIAGNOSIS — D3131 Benign neoplasm of right choroid: Secondary | ICD-10-CM

## 2021-10-12 DIAGNOSIS — H35412 Lattice degeneration of retina, left eye: Secondary | ICD-10-CM

## 2021-10-12 NOTE — Progress Notes (Signed)
10/12/2021     CHIEF COMPLAINT Patient presents for  Chief Complaint  Patient presents with   Retina Follow Up      HISTORY OF PRESENT ILLNESS: Barbara Greene is a 65 y.o. female who presents to the clinic today for:   HPI     Retina Follow Up           Diagnosis: Benign Neoplasm of Choroid.   Laterality: right eye   Onset: 1 year ago         Comments   1 yr fu OU FP, B Scan OD. Patient states vision is stable and unchanged since last visit. Denies any new floaters or FOL. Denies hospitalizations or surgeries since last visit.       Last edited by Laurin Coder on 10/12/2021  1:34 PM.      Referring physician: Warden Fillers, MD [address] [place],  [place] [address]  HISTORICAL INFORMATION:   Selected notes from the MEDICAL RECORD NUMBER       CURRENT MEDICATIONS: No current outpatient medications on file. (Ophthalmic Drugs)   No current facility-administered medications for this visit. (Ophthalmic Drugs)   Current Outpatient Medications (Other)  Medication Sig   amoxicillin (AMOXIL) 500 MG capsule Take 2 capsules (1,000 mg total) by mouth now, then take 1 capsule ('500mg'$ ) by mouth 3 times daily until gone   calcium carbonate (OSCAL) 1500 (600 Ca) MG TABS tablet Take by mouth daily with breakfast.   diazepam (VALIUM) 5 MG tablet Take 1-2 tablets (5-10 mg total) by mouth before bedtime. Take 1-2 tablets (5-'10mg'$ ) 1 hour prior to dental appointment   escitalopram (LEXAPRO) 10 MG tablet Take 10 mg daily by mouth.   escitalopram (LEXAPRO) 10 MG tablet TAKE 1 TABLET BY MOUTH ONCE DAILY   escitalopram (LEXAPRO) 10 MG tablet Take 1 tablet (10 mg total) by mouth daily.   Na Sulfate-K Sulfate-Mg Sulf 17.5-3.13-1.6 GM/177ML SOLN Use as directed   oxyCODONE-acetaminophen (PERCOCET/ROXICET) 5-325 MG tablet Take 1-2 tablets by mouth every 6 (six) hours as needed for severe pain.   oxyCODONE-acetaminophen (PERCOCET/ROXICET) 5-325 MG tablet Take  1-2 tablets by mouth every 6 (six) hours as needed for severe pain.   rosuvastatin (CRESTOR) 10 MG tablet Take 1 tablet (10 mg total) by mouth daily.   rosuvastatin (CRESTOR) 40 MG tablet Take 40 mg by mouth daily. Every other day   No current facility-administered medications for this visit. (Other)      REVIEW OF SYSTEMS: ROS   Negative for: Constitutional, Gastrointestinal, Neurological, Skin, Genitourinary, Musculoskeletal, HENT, Endocrine, Cardiovascular, Eyes, Respiratory, Psychiatric, Allergic/Imm, Heme/Lymph Last edited by Hurman Horn, MD on 10/12/2021  2:50 PM.       ALLERGIES No Known Allergies  PAST MEDICAL HISTORY Past Medical History:  Diagnosis Date   Anxiety    Depression    Hyperlipidemia    Past Surgical History:  Procedure Laterality Date   BUNIONECTOMY WITH CHILECTOMY Left 03/23/2017   Procedure: CHEVRON OSTEOTOMY WITH BUNIONECTOMY;  Surgeon: Dorna Leitz, MD;  Location: [facility];  Service: Orthopedics;  Laterality: Left;   CESAREAN SECTION     COLONOSCOPY WITH PROPOFOL N/A 06/08/2016   Procedure: COLONOSCOPY WITH PROPOFOL;  Surgeon: Garlan Fair, MD;  Location: [facility];  Service: Endoscopy;  Laterality: N/A;   METATARSAL OSTEOTOMY WITH BUNIONECTOMY Right 03/05/2019   Procedure: CHEVRON OSTEOTOMY WITH BUNIONECTOMY;  Surgeon: Dorna Leitz, MD;  Location: [facility];  Service: Orthopedics;  Laterality:  Right;    FAMILY HISTORY History reviewed. No pertinent family history.  SOCIAL HISTORY Social History   Tobacco Use   Smoking status: Never   Smokeless tobacco: Never  Vaping Use   Vaping Use: Never used  Substance Use Topics   Alcohol use: Yes    Comment: occasional    Drug use: No         OPHTHALMIC EXAM:  Base Eye Exam     Visual Acuity (ETDRS)       Right Left   Dist cc 20/20 20/25 +1    Correction: Glasses         Tonometry (Tonopen, 1:35 PM)       Right Left   Pressure 11 8          Pupils       Pupils Dark Light APD   Right PERRL 4 3 None   Left PERRL 4 3 None         Visual Fields (Counting fingers)       Left Right    Full Full         Extraocular Movement       Right Left    Full Full         Neuro/Psych     Oriented x3: Yes   Mood/Affect: Normal         Dilation     Both eyes: 1.0% Mydriacyl, 2.5% Phenylephrine @ 1:35 PM           Slit Lamp and Fundus Exam     External Exam       Right Left   External Normal Normal         Slit Lamp Exam       Right Left   Lids/Lashes Normal Normal   Conjunctiva/Sclera White and quiet White and quiet   Cornea Clear Clear   Anterior Chamber Deep and quiet Deep and quiet   Iris Round and reactive Round and reactive   Lens Nuclear sclerosis Nuclear sclerosis   Anterior Vitreous Normal Normal         Fundus Exam       Right Left   Posterior Vitreous Normal Normal   Disc Normal Normal   C/D Ratio 0.35 0.3   Macula Normal Normal   Vessels Normal Normal   Periphery Temporal nevus 3.5x2.5DD, flat, no orange lipofuscin, no fluid no apparent thickness regularly pigmented Lattice degeneration, 5-6 o'clock no holes           Refraction     Wearing Rx     Age: 10 yrs            IMAGING AND PROCEDURES  Imaging and Procedures for 10/12/21  OCT, Retina - OU - Both Eyes       Right Eye Quality was good. Scan locations included subfoveal. Central Foveal Thickness: 245. Progression has been stable.   Left Eye Quality was good. Scan locations included subfoveal. Central Foveal Thickness: 241. Progression has been stable. Findings include normal foveal contour.   Notes Incidental PVD OD      Color Fundus Photography Optos - OU - Both Eyes       Right Eye Progression has been stable. Disc findings include normal observations. Vessels : normal observations.   Left Eye Progression has been stable. Disc findings include normal observations. Macula : normal  observations. Vessels : normal observations. Periphery : normal observations.   Notes Choroidal nevus temporal macula no interval change over the  ensuing 7 years.      B-Scan Ultrasound - OD - Right Eye       Flat choroidal nevus temporal portion of macula, 1.34 mm thickness, no subretinal fluid, homogeneous intensity             ASSESSMENT/PLAN:  Benign neoplasm of choroid, right OD no change over time flat choroidal nevus is stable for the last 7 years photographically clinically with no high risk features  Lattice degeneration of retina, left Minor, stable no high risk features peripheral lattice     ICD-10-CM   1. Benign neoplasm of choroid, right  D31.31 OCT, Retina - OU - Both Eyes    Color Fundus Photography Optos - OU - Both Eyes    B-Scan Ultrasound - OD - Right Eye    CANCELED: B-Scan Ultrasound - OD - Right Eye    2. 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  Benign, choroidal nevus temporally OD, no interval change for 7 years.  No high risk features  2.  Follow-up Dr. Warden Fillers as scheduled,  3.  Follow-up here in 1 year  Ophthalmic Meds Ordered this visit:  No orders of the defined types were placed in this encounter.      Return in about 1 year (around 10/13/2022) for DILATE OU, COLOR FP, OCT, B-Scan U/S, OD.  There are no Patient Instructions on file for this visit.   Explained the diagnoses, plan, and follow up with the patient and they expressed understanding.  Patient expressed understanding of the importance of proper follow up care.   Clent Demark Domonic Kimball M.D. Diseases & Surgery of the Retina and Vitreous [facility] 10/12/21     Abbreviations: M myopia (nearsighted); A astigmatism; H hyperopia (farsighted); P presbyopia; Mrx spectacle prescription;  CTL contact lenses; OD right eye; OS left eye; OU both eyes  XT exotropia; ET esotropia; PEK punctate epithelial keratitis; PEE punctate epithelial  erosions; DES dry eye syndrome; MGD meibomian gland dysfunction; ATs artificial tears; PFAT's preservative free artificial tears; [place] nuclear sclerotic cataract; PSC posterior subcapsular cataract; ERM epi-retinal membrane; PVD posterior vitreous detachment; RD retinal detachment; DM diabetes mellitus; DR diabetic retinopathy; NPDR non-proliferative diabetic retinopathy; PDR proliferative diabetic retinopathy; CSME clinically significant macular edema; DME diabetic macular edema; dbh dot blot hemorrhages; CWS cotton wool spot; POAG primary open angle glaucoma; C/D cup-to-disc ratio; HVF humphrey visual field; GVF goldmann visual field; OCT optical coherence tomography; IOP intraocular pressure; BRVO Branch retinal vein occlusion; CRVO central retinal vein occlusion; CRAO central retinal artery occlusion; BRAO branch retinal artery occlusion; RT retinal tear; SB scleral buckle; PPV pars plana vitrectomy; VH Vitreous hemorrhage; PRP panretinal laser photocoagulation; IVK intravitreal kenalog; VMT vitreomacular traction; MH Macular hole;  NVD neovascularization of the disc; NVE neovascularization elsewhere; AREDS age related eye disease study; ARMD age related macular degeneration; POAG primary open angle glaucoma; EBMD epithelial/anterior basement membrane dystrophy; ACIOL anterior chamber intraocular lens; IOL intraocular lens; PCIOL posterior chamber intraocular lens; Phaco/IOL phacoemulsification with intraocular lens placement; [place] photorefractive keratectomy; LASIK laser assisted in situ keratomileusis; HTN hypertension; DM diabetes mellitus; COPD chronic obstructive pulmonary disease

## 2021-10-12 NOTE — Assessment & Plan Note (Signed)
OD no change over time flat choroidal nevus is stable for the last 7 years photographically clinically with no high risk features

## 2021-10-12 NOTE — Assessment & Plan Note (Signed)
Minor, stable no high risk features peripheral lattice

## 2021-11-27 ENCOUNTER — Other Ambulatory Visit (HOSPITAL_COMMUNITY): Payer: Self-pay

## 2022-01-08 ENCOUNTER — Other Ambulatory Visit (HOSPITAL_COMMUNITY): Payer: Self-pay

## 2022-03-11 ENCOUNTER — Other Ambulatory Visit (HOSPITAL_COMMUNITY): Payer: Self-pay

## 2022-03-11 MED ORDER — ESCITALOPRAM OXALATE 10 MG PO TABS
10.0000 mg | ORAL_TABLET | Freq: Every day | ORAL | 3 refills | Status: AC
  Filled 2022-03-11: qty 90, 90d supply, fill #0
  Filled 2022-06-18: qty 90, 90d supply, fill #1
  Filled 2022-09-22: qty 90, 90d supply, fill #2
  Filled 2022-12-16: qty 90, 90d supply, fill #3

## 2022-03-12 ENCOUNTER — Other Ambulatory Visit (HOSPITAL_COMMUNITY): Payer: Self-pay

## 2022-03-12 MED ORDER — ERGOCALCIFEROL 1.25 MG (50000 UT) PO CAPS
50000.0000 [IU] | ORAL_CAPSULE | ORAL | 0 refills | Status: DC
  Filled 2022-03-12: qty 12, 84d supply, fill #0

## 2022-06-18 ENCOUNTER — Other Ambulatory Visit (HOSPITAL_COMMUNITY): Payer: Self-pay

## 2022-06-18 DIAGNOSIS — E559 Vitamin D deficiency, unspecified: Secondary | ICD-10-CM | POA: Diagnosis not present

## 2022-07-22 ENCOUNTER — Other Ambulatory Visit: Payer: Self-pay

## 2022-07-22 ENCOUNTER — Other Ambulatory Visit (HOSPITAL_COMMUNITY): Payer: Self-pay

## 2022-09-21 DIAGNOSIS — E559 Vitamin D deficiency, unspecified: Secondary | ICD-10-CM | POA: Diagnosis not present

## 2022-10-18 ENCOUNTER — Encounter (INDEPENDENT_AMBULATORY_CARE_PROVIDER_SITE_OTHER): Payer: No Typology Code available for payment source | Admitting: Ophthalmology

## 2022-10-18 DIAGNOSIS — H35413 Lattice degeneration of retina, bilateral: Secondary | ICD-10-CM | POA: Diagnosis not present

## 2022-10-18 DIAGNOSIS — D3131 Benign neoplasm of right choroid: Secondary | ICD-10-CM | POA: Diagnosis not present

## 2022-11-02 ENCOUNTER — Other Ambulatory Visit (HOSPITAL_COMMUNITY): Payer: Self-pay

## 2022-11-03 ENCOUNTER — Other Ambulatory Visit (HOSPITAL_COMMUNITY): Payer: Self-pay

## 2022-11-03 MED ORDER — ROSUVASTATIN CALCIUM 10 MG PO TABS
10.0000 mg | ORAL_TABLET | Freq: Every day | ORAL | 1 refills | Status: DC
  Filled 2022-11-03: qty 90, 90d supply, fill #0
  Filled 2023-02-08: qty 90, 90d supply, fill #1

## 2022-11-09 ENCOUNTER — Other Ambulatory Visit (HOSPITAL_COMMUNITY): Payer: Self-pay

## 2022-11-16 DIAGNOSIS — Z6821 Body mass index (BMI) 21.0-21.9, adult: Secondary | ICD-10-CM | POA: Diagnosis not present

## 2022-11-16 DIAGNOSIS — N958 Other specified menopausal and perimenopausal disorders: Secondary | ICD-10-CM | POA: Diagnosis not present

## 2022-11-16 DIAGNOSIS — Z01419 Encounter for gynecological examination (general) (routine) without abnormal findings: Secondary | ICD-10-CM | POA: Diagnosis not present

## 2022-11-16 DIAGNOSIS — Z1231 Encounter for screening mammogram for malignant neoplasm of breast: Secondary | ICD-10-CM | POA: Diagnosis not present

## 2022-11-16 DIAGNOSIS — M8588 Other specified disorders of bone density and structure, other site: Secondary | ICD-10-CM | POA: Diagnosis not present

## 2022-12-17 ENCOUNTER — Other Ambulatory Visit (HOSPITAL_COMMUNITY): Payer: Self-pay

## 2023-02-22 DIAGNOSIS — D2271 Melanocytic nevi of right lower limb, including hip: Secondary | ICD-10-CM | POA: Diagnosis not present

## 2023-02-22 DIAGNOSIS — L821 Other seborrheic keratosis: Secondary | ICD-10-CM | POA: Diagnosis not present

## 2023-02-22 DIAGNOSIS — Z85820 Personal history of malignant melanoma of skin: Secondary | ICD-10-CM | POA: Diagnosis not present

## 2023-02-22 DIAGNOSIS — C44319 Basal cell carcinoma of skin of other parts of face: Secondary | ICD-10-CM | POA: Diagnosis not present

## 2023-02-22 DIAGNOSIS — L720 Epidermal cyst: Secondary | ICD-10-CM | POA: Diagnosis not present

## 2023-02-22 DIAGNOSIS — C44311 Basal cell carcinoma of skin of nose: Secondary | ICD-10-CM | POA: Diagnosis not present

## 2023-02-22 DIAGNOSIS — D225 Melanocytic nevi of trunk: Secondary | ICD-10-CM | POA: Diagnosis not present

## 2023-02-22 DIAGNOSIS — D224 Melanocytic nevi of scalp and neck: Secondary | ICD-10-CM | POA: Diagnosis not present

## 2023-02-22 DIAGNOSIS — H0265 Xanthelasma of left lower eyelid: Secondary | ICD-10-CM | POA: Diagnosis not present

## 2023-03-15 DIAGNOSIS — Z85820 Personal history of malignant melanoma of skin: Secondary | ICD-10-CM | POA: Diagnosis not present

## 2023-03-15 DIAGNOSIS — M8580 Other specified disorders of bone density and structure, unspecified site: Secondary | ICD-10-CM | POA: Diagnosis not present

## 2023-03-15 DIAGNOSIS — Z0000 Encounter for general adult medical examination without abnormal findings: Secondary | ICD-10-CM | POA: Diagnosis not present

## 2023-03-15 DIAGNOSIS — Z79899 Other long term (current) drug therapy: Secondary | ICD-10-CM | POA: Diagnosis not present

## 2023-03-15 DIAGNOSIS — F325 Major depressive disorder, single episode, in full remission: Secondary | ICD-10-CM | POA: Diagnosis not present

## 2023-03-15 DIAGNOSIS — E7800 Pure hypercholesterolemia, unspecified: Secondary | ICD-10-CM | POA: Diagnosis not present

## 2023-03-15 DIAGNOSIS — E559 Vitamin D deficiency, unspecified: Secondary | ICD-10-CM | POA: Diagnosis not present

## 2023-03-18 ENCOUNTER — Encounter (HOSPITAL_COMMUNITY): Payer: Self-pay

## 2023-03-18 ENCOUNTER — Other Ambulatory Visit (HOSPITAL_COMMUNITY): Payer: Self-pay

## 2023-03-18 MED ORDER — ROSUVASTATIN CALCIUM 10 MG PO TABS
10.0000 mg | ORAL_TABLET | Freq: Every day | ORAL | 3 refills | Status: AC
  Filled 2023-03-18: qty 90, 90d supply, fill #0

## 2023-03-21 DIAGNOSIS — N281 Cyst of kidney, acquired: Secondary | ICD-10-CM | POA: Diagnosis not present

## 2023-03-21 DIAGNOSIS — R3129 Other microscopic hematuria: Secondary | ICD-10-CM | POA: Diagnosis not present

## 2023-04-06 DIAGNOSIS — C4401 Basal cell carcinoma of skin of lip: Secondary | ICD-10-CM | POA: Diagnosis not present

## 2023-04-06 DIAGNOSIS — Z85828 Personal history of other malignant neoplasm of skin: Secondary | ICD-10-CM | POA: Diagnosis not present

## 2023-05-03 ENCOUNTER — Encounter: Payer: Self-pay | Admitting: Orthopedic Surgery

## 2023-05-03 ENCOUNTER — Ambulatory Visit: Payer: PPO | Admitting: Orthopedic Surgery

## 2023-05-03 ENCOUNTER — Other Ambulatory Visit (INDEPENDENT_AMBULATORY_CARE_PROVIDER_SITE_OTHER): Payer: Self-pay

## 2023-05-03 VITALS — BP 138/72 | HR 90 | Ht 62.5 in | Wt 114.0 lb

## 2023-05-03 DIAGNOSIS — M545 Low back pain, unspecified: Secondary | ICD-10-CM

## 2023-05-03 NOTE — Patient Instructions (Signed)

## 2023-05-03 NOTE — Progress Notes (Signed)
 New Patient Visit  Assessment: Barbara Greene is a 68 y.o. female with the following: Left buttock pain   Plan: Barbara Greene continues to have pain in the left buttock.  Pain is not recreated with resisted left knee flexion.  Radiographs of the lumbar spine are without anterolisthesis, or acute injury.  She states her pain is continue to improve.  As such, we discussed multiple treatment options.  She will consider heat versus ice, over-the-counter medications and exercises with possible physical therapy in the future.  I provided her with some home exercises to initiate.  If she is not getting better, she will contact the clinic  Follow-up: Return if symptoms worsen or fail to improve.  Subjective:  Chief Complaint  Patient presents with   Back Pain    Pain in L buttocks after a fall 04/09/23 pt states pain is getting better but still has a pulling sensation in the glut when raises the leg.     History of Present Illness: Barbara Greene is a 68 y.o. female who presents for evaluation of left buttock pain.  She reports that she fell down some stairs, approximately 3 weeks ago.  She landed on her back.  No pain initially, but over the next few days, she had general stiffness.  She started develop worsening pain in the left buttock.  This has restricted her activities.  She does not like take medications.  She has not been taking medication since this happened.  She reports that the pain has progressively improved.  She has occasional radiating pains distally from her left buttock.  No prior injuries to her lower back.  This is not happened to her before.   Review of Systems: No fevers or chills No numbness or tingling No chest pain No shortness of breath No bowel or bladder dysfunction No GI distress No headaches   Medical History:  Past Medical History:  Diagnosis Date   Anxiety    Depression    Hyperlipidemia     Past Surgical History:  Procedure Laterality  Date   BUNIONECTOMY WITH CHILECTOMY Left 03/23/2017   Procedure: CHEVRON OSTEOTOMY WITH BUNIONECTOMY;  Surgeon: Yvone Rush, MD;  Location: [facility];  Service: Orthopedics;  Laterality: Left;   CESAREAN SECTION     COLONOSCOPY WITH PROPOFOL  N/A 06/08/2016   Procedure: COLONOSCOPY WITH PROPOFOL ;  Surgeon: Gladis MARLA Louder, MD;  Location: [facility];  Service: Endoscopy;  Laterality: N/A;   METATARSAL OSTEOTOMY WITH BUNIONECTOMY Right 03/05/2019   Procedure: CHEVRON OSTEOTOMY WITH BUNIONECTOMY;  Surgeon: Yvone Rush, MD;  Location: [facility];  Service: Orthopedics;  Laterality: Right;    History reviewed. No pertinent family history. Social History   Tobacco Use   Smoking status: Never   Smokeless tobacco: Never  Vaping Use   Vaping status: Never Used  Substance Use Topics   Alcohol use: Yes    Comment: occasional    Drug use: No    No Known Allergies  Current Meds  Medication Sig   calcium  carbonate (OSCAL) 1500 (600 Ca) MG TABS tablet Take by mouth daily with breakfast.   escitalopram  (LEXAPRO ) 10 MG tablet Take 10 mg daily by mouth.   escitalopram  (LEXAPRO ) 10 MG tablet Take 1 tablet (10 mg total) by mouth daily.   escitalopram  (LEXAPRO ) 10 MG tablet Take 1 tablet (10 mg total) by mouth daily.   oxyCODONE -acetaminophen  (PERCOCET/ROXICET) 5-325 MG tablet Take 1-2 tablets by mouth every 6 (six) hours as needed for  severe pain.   rosuvastatin  (CRESTOR ) 10 MG tablet Take 1 tablet (10 mg total) by mouth daily.   rosuvastatin  (CRESTOR ) 40 MG tablet Take 40 mg by mouth daily. Every other day    Objective: BP 138/72   Pulse 90   Ht 5' 2.5 (1.588 m)   Wt 114 lb (51.7 kg)   BMI 20.52 kg/m   Physical Exam:  General: Alert and oriented. and No acute distress. Gait: Normal gait.  Evaluation of lower back demonstrates no deformity.  No tenderness to palpation.  Mild tenderness to palpation within the left buttock.  No pain with resisted knee  flexion.  Negative straight leg raise.  Bilateral lower extremity strength is 5/5.  Sensation intact distally.  3+ patellar tendon reflexes bilaterally.  IMAGING: I personally ordered and reviewed the following images  Standing lumbar spine x-rays were obtained in clinic today.  No acute injuries noted.  No anterolisthesis.  Well-maintained disc height.  In the superior aspect of the L4 vertebral body, there is an osteophyte versus well-corticated bony fragment.  No additional findings at this level.  Well-maintained disc height.    Impression: Lumbar spine x-ray without acute injury   New Medications:  No orders of the defined types were placed in this encounter.     Oneil DELENA Horde, MD  05/03/2023 2:58 PM

## 2023-07-22 DIAGNOSIS — R52 Pain, unspecified: Secondary | ICD-10-CM | POA: Diagnosis not present

## 2023-07-22 DIAGNOSIS — R051 Acute cough: Secondary | ICD-10-CM | POA: Diagnosis not present

## 2023-07-22 DIAGNOSIS — R5383 Other fatigue: Secondary | ICD-10-CM | POA: Diagnosis not present

## 2023-09-29 DIAGNOSIS — Z820 Family history of epilepsy and other diseases of the nervous system: Secondary | ICD-10-CM | POA: Diagnosis not present

## 2023-09-29 DIAGNOSIS — F419 Anxiety disorder, unspecified: Secondary | ICD-10-CM | POA: Diagnosis not present

## 2023-09-29 DIAGNOSIS — H8113 Benign paroxysmal vertigo, bilateral: Secondary | ICD-10-CM | POA: Diagnosis not present

## 2023-09-29 DIAGNOSIS — R030 Elevated blood-pressure reading, without diagnosis of hypertension: Secondary | ICD-10-CM | POA: Diagnosis not present

## 2023-10-05 NOTE — Therapy (Signed)
 OUTPATIENT PHYSICAL THERAPY VESTIBULAR EVALUATION     Patient Name: Barbara Greene MRN: [id number] DOB:[birth year], 68 y.o., female Today's Date: 10/06/2023  END OF SESSION:  PT End of Session - 10/06/23 0938     Visit Number 1    Number of Visits 4    Date for PT Re-Evaluation 11/03/23    Authorization Type HTN    Authorization Time Period no auth needed    PT Start Time 0931    PT Stop Time 1010    PT Time Calculation (min) 39 min    Activity Tolerance Patient tolerated treatment well    Behavior During Therapy [facility] for tasks assessed/performed          Past Medical History:  Diagnosis Date   Anxiety    Depression    Hyperlipidemia    Past Surgical History:  Procedure Laterality Date   BUNIONECTOMY WITH CHILECTOMY Left 03/23/2017   Procedure: CHEVRON OSTEOTOMY WITH BUNIONECTOMY;  Surgeon: Neil Balls, MD;  Location: [facility];  Service: Orthopedics;  Laterality: Left;   CESAREAN SECTION     COLONOSCOPY WITH PROPOFOL  N/A 06/08/2016   Procedure: COLONOSCOPY WITH PROPOFOL ;  Surgeon: Garrett Kallman, MD;  Location: [facility];  Service: Endoscopy;  Laterality: N/A;   METATARSAL OSTEOTOMY WITH BUNIONECTOMY Right 03/05/2019   Procedure: CHEVRON OSTEOTOMY WITH BUNIONECTOMY;  Surgeon: Neil Balls, MD;  Location: [facility];  Service: Orthopedics;  Laterality: Right;   Patient Active Problem List   Diagnosis Date Noted   Benign neoplasm of choroid, 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   Lattice degeneration, 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   Lattice degeneration of retina, 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   Nuclear sclerotic cataract of both eyes 10/04/2019   Hallux valgus, right 03/05/2019   Hallux valgus with bunions, right 03/05/2019   Hallux valgus with bunions, left 03/23/2017   UNEQUAL LEG LENGTH 02/02/2010   BUNIONS, BILATERAL 12/16/2009   FOOT PAIN, LEFT 12/16/2009    PCP: Lysle Saunas MD REFERRING PROVIDER:   Tena Feeling, MD none none    REFERRING DIAG: H81.13  (ICD-10-CM) - Benign paroxysmal vertigo, bilateral  THERAPY DIAG:  Dizziness and giddiness  ONSET DATE: March 2025  Rationale for Evaluation and Treatment: Rehabilitation  SUBJECTIVE:   SUBJECTIVE STATEMENT: Has had vertigo in the past maybe 3 episodes or so but has had the Epley done and resolved; has noticed continued issues with dizziness with laying her head back to get her hair done, the dentist and yoga Pt accompanied by: self  PERTINENT HISTORY: anxiety and depression Retired dietician/nutritionist  PAIN:  Are you having pain? No  PRECAUTIONS: None  RED FLAGS: None   WEIGHT BEARING RESTRICTIONS: No  FALLS: Has patient fallen in last 6 months? Yes. Number of falls 2  PLOF: Independent  PATIENT GOALS: not to be dizzy  OBJECTIVE:  Note: Objective measures were completed at Evaluation unless otherwise noted.  DIAGNOSTIC FINDINGS: na  COGNITION: Overall cognitive status: Within functional limits for tasks assessed   SENSATION: WFL  EDEMA:  No swelling   POSTURE:  rounded shoulders and forward head  Cervical ROM:  appears wnl  Active AROM (deg) eval  Flexion   Extension   Right lateral flexion   Left lateral flexion   Right rotation   Left rotation   (Blank rows = not tested)  STRENGTH: no issues noted  LOWER EXTREMITY MMT:   MMT Right eval Left eval  Hip flexion    Hip abduction    Hip adduction    Hip  internal rotation    Hip external rotation    Knee flexion    Knee extension    Ankle dorsiflexion    Ankle plantarflexion    Ankle inversion    Ankle eversion    (Blank rows = not tested)  BED MOBILITY:  Sit to supine Modified independence Supine to sit Modified independence  TRANSFERS: Assistive device utilized: None  Sit to stand: Modified independence Stand to sit: Modified independence Chair to chair: Modified independence Floor: not tested  GAIT: Gait pattern: WFL Distance walked: 50 ft in clinic Assistive device  utilized: None Level of assistance: Modified independence Comments: mild guarding with balance  FUNCTIONAL TESTS:  Next visit  PATIENT SURVEYS:  DHI next visit  VESTIBULAR ASSESSMENT:  GENERAL OBSERVATION: glasses; anxious about visit   SYMPTOM BEHAVIOR:  Subjective history: see above  Non-Vestibular symptoms: none  Type of dizziness: Spinning/Vertigo  Frequency: 3 times since march  Duration: varies but usually very brief  Aggravating factors: Induced by position change: lying supine  Relieving factors: head stationary, closing eyes, rest, and slow movements  Progression of symptoms: unchanged  OCULOMOTOR EXAM:  Ocular Alignment: normal  Ocular ROM: No Limitations  Spontaneous Nystagmus: absent  Gaze-Induced Nystagmus: not tested  Smooth Pursuits: not tested  Saccades: not tested  Convergence/Divergence:  cm    VESTIBULAR - OCULAR REFLEX:   Slow VOR: Comment: not tested  VOR Cancellation: Comment: not tested  Head-Impulse Test:   Dynamic Visual Acuity: not tested   POSITIONAL TESTING: Right Sidelying: upbeating, right nystagmus  MOTION SENSITIVITY:  Motion Sensitivity Quotient Intensity: 0 = none, 1 = Lightheaded, 2 = Mild, 3 = Moderate, 4 = Severe, 5 = Vomiting  Intensity  1. Sitting to supine   2. Supine to L side   3. Supine to R side   4. Supine to sitting   5. L Hallpike-Dix   6. Up from L    7. R Hallpike-Dix   8. Up from R    9. Sitting, head tipped to L knee   10. Head up from L knee   11. Sitting, head tipped to R knee   12. Head up from R knee   13. Sitting head turns x5   14.Sitting head nods x5   15. In stance, 180 turn to L    16. In stance, 180 turn to R     OTHOSTATICS: not done  FUNCTIONAL GAIT: next visit                                                                                                                             TREATMENT DATE: 10/06/23 physical therapy evaluation and HEP instruction   Canalith  Repositioning:  [place] Right Posterior: Number of Reps: 2 and Response to Treatment: symptoms improved Gaze Adaptation:   Habituation:   Other:   PATIENT EDUCATION: Education details: Patient educated on exam findings, POC, scope of PT, HEP, and education on BPPV. Person educated:  Patient Education method: Explanation, Demonstration, and Handouts Education comprehension: verbalized understanding, returned demonstration, verbal cues required, and tactile cues required   HOME EXERCISE PROGRAM: Access Code: [email or web address] URL: [email or web address] Date: 10/06/2023 Prepared by: AP - Rehab  Patient Education - BPPV - What Is BPPV? - BPPV - BPPV - After BPPV Repositioning GOALS: Goals reviewed with patient? No  SHORT TERM GOALS: Target date: 10/20/2023  Patient will report 50% improvement overall   Baseline: Goal status: INITIAL   LONG TERM GOALS: Target date: 11/03/2023  Patient will report 90% improvement overall   Baseline:  Goal status: INITIAL  2.  Patient will be able to lie supine for hair appointment without any dizziness Baseline:  Goal status: INITIAL  3.  Patient will improve DHI score to 5 or less to demonstrate minimal dizziness Baseline:  Goal status: INITIAL  ASSESSMENT:  CLINICAL IMPRESSION: Patient is a 68 y.o. female who was seen today for physical therapy evaluation and treatment for H81.13 (ICD-10-CM) - Benign paroxysmal vertigo, bilateral. Patient demonstrates increased vestibular symptoms with provocative testing which is negatively impacting patient ability to perform ADLs and functional mobility tasks. Patient will benefit from skilled physical therapy services to address these deficits to improve level of function with ADLs, functional mobility tasks, and reduce risk for falls.    OBJECTIVE IMPAIRMENTS: Abnormal gait, decreased activity tolerance, and dizziness.   ACTIVITY LIMITATIONS: bending and  hygiene/grooming  PARTICIPATION LIMITATIONS: community activity  REHAB POTENTIAL: Good  CLINICAL DECISION MAKING: Evolving/moderate complexity  EVALUATION COMPLEXITY: Moderate   PLAN:  PT FREQUENCY: 1x/week  PT DURATION: 4 weeks  PLANNED INTERVENTIONS: 97164- PT Re-evaluation, 97110-Therapeutic exercises, 97530- Therapeutic activity, [id number]- Neuromuscular re-education, 97535- Self Care, 16109- Manual therapy, [id number]- Gait training, [phone]- Orthotic Fit/training, [phone]- Canalith repositioning, [id number]- Aquatic Therapy, 97760- Splinting, [id number]- Wound care (first 20 sq cm), 97598- Wound care (each additional 20 sq cm)Patient/Family education, Balance training, Stair training, Taping, Dry Needling, Joint mobilization, Joint manipulation, Spinal manipulation, Spinal mobilization, Scar mobilization, and DME instructions.   PLAN FOR NEXT SESSION: Review HEP and goals; recheck right posterior canal with sidelying test; if negative check left posterior canal; DHI   12:41 PM, 10/06/23 Barbara Greene Small Lilliemae Fruge MPT [place] [facility] [place] [phone] Ph:[phone]

## 2023-10-06 ENCOUNTER — Other Ambulatory Visit: Payer: Self-pay

## 2023-10-06 ENCOUNTER — Ambulatory Visit (HOSPITAL_COMMUNITY): Attending: Internal Medicine

## 2023-10-06 DIAGNOSIS — R42 Dizziness and giddiness: Secondary | ICD-10-CM | POA: Diagnosis not present

## 2023-10-11 DIAGNOSIS — H8113 Benign paroxysmal vertigo, bilateral: Secondary | ICD-10-CM | POA: Diagnosis not present

## 2023-10-11 DIAGNOSIS — R002 Palpitations: Secondary | ICD-10-CM | POA: Diagnosis not present

## 2023-10-11 DIAGNOSIS — E785 Hyperlipidemia, unspecified: Secondary | ICD-10-CM | POA: Diagnosis not present

## 2023-10-11 DIAGNOSIS — E7800 Pure hypercholesterolemia, unspecified: Secondary | ICD-10-CM | POA: Diagnosis not present

## 2023-10-13 ENCOUNTER — Encounter (HOSPITAL_COMMUNITY): Payer: Self-pay

## 2023-10-13 ENCOUNTER — Ambulatory Visit (HOSPITAL_COMMUNITY)

## 2023-10-13 DIAGNOSIS — R42 Dizziness and giddiness: Secondary | ICD-10-CM

## 2023-10-13 NOTE — Therapy (Signed)
 OUTPATIENT PHYSICAL THERAPY VESTIBULAR TREATMENT     Patient Name: Barbara Greene MRN: [id number] DOB:1955-08-30, 68 y.o., female Today's Date: 10/13/2023  END OF SESSION:  PT End of Session - 10/13/23 1726     Visit Number 2    Number of Visits 4    Date for PT Re-Evaluation 11/03/23    Authorization Type HTN    Authorization Time Period no auth needed    PT Start Time 1645    PT Stop Time 1726    PT Time Calculation (min) 41 min    Activity Tolerance Patient tolerated treatment well    Behavior During Therapy WFL for tasks assessed/performed           Past Medical History:  Diagnosis Date   Anxiety    Depression    Hyperlipidemia    Past Surgical History:  Procedure Laterality Date   BUNIONECTOMY WITH CHILECTOMY Left 03/23/2017   Procedure: CHEVRON OSTEOTOMY WITH BUNIONECTOMY;  Surgeon: Neil Balls, MD;  Location: [facility];  Service: Orthopedics;  Laterality: Left;   CESAREAN SECTION     COLONOSCOPY WITH PROPOFOL  N/A 06/08/2016   Procedure: COLONOSCOPY WITH PROPOFOL ;  Surgeon: Garrett Kallman, MD;  Location: [facility];  Service: Endoscopy;  Laterality: N/A;   METATARSAL OSTEOTOMY WITH BUNIONECTOMY Right 03/05/2019   Procedure: CHEVRON OSTEOTOMY WITH BUNIONECTOMY;  Surgeon: Neil Balls, MD;  Location: [facility];  Service: Orthopedics;  Laterality: Right;   Patient Active Problem List   Diagnosis Date Noted   Benign neoplasm of choroid, 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   Lattice degeneration, 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   Lattice degeneration of retina, 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   Nuclear sclerotic cataract of both eyes 10/04/2019   Hallux valgus, right 03/05/2019   Hallux valgus with bunions, right 03/05/2019   Hallux valgus with bunions, left 03/23/2017   UNEQUAL LEG LENGTH 02/02/2010   BUNIONS, BILATERAL 12/16/2009   FOOT PAIN, LEFT 12/16/2009    PCP: Lysle Saunas MD REFERRING PROVIDER:   Tena Feeling, MD none none    REFERRING DIAG: H81.13  (ICD-10-CM) - Benign paroxysmal vertigo, bilateral  THERAPY DIAG:  Dizziness and giddiness  ONSET DATE: March 2025  Rationale for Evaluation and Treatment: Rehabilitation  SUBJECTIVE:   SUBJECTIVE STATEMENT: Pt states she has not had any symptoms since last session but has still been very anxious about it. Pt states she sort of attempted provacative position but is hesitant to attempt due to hating sensation.  Has had vertigo in the past maybe 3 episodes or so but has had the Epley done and resolved; has noticed continued issues with dizziness with laying her head back to get her hair done, the dentist and yoga Pt accompanied by: self  PERTINENT HISTORY: anxiety and depression Retired dietician/nutritionist  PAIN:  Are you having pain? No  PRECAUTIONS: None  RED FLAGS: None   WEIGHT BEARING RESTRICTIONS: No  FALLS: Has patient fallen in last 6 months? Yes. Number of falls 2  PLOF: Independent  PATIENT GOALS: not to be dizzy  OBJECTIVE:  Note: Objective measures were completed at Evaluation unless otherwise noted.  DIAGNOSTIC FINDINGS: na  COGNITION: Overall cognitive status: Within functional limits for tasks assessed   SENSATION: WFL  EDEMA:  No swelling   POSTURE:  rounded shoulders and forward head  Cervical ROM:  appears wnl  Active AROM (deg) eval  Flexion   Extension   Right lateral flexion   Left lateral flexion   Right rotation   Left rotation   (  Blank rows = not tested)  STRENGTH: no issues noted  LOWER EXTREMITY MMT:   MMT Right eval Left eval  Hip flexion    Hip abduction    Hip adduction    Hip internal rotation    Hip external rotation    Knee flexion    Knee extension    Ankle dorsiflexion    Ankle plantarflexion    Ankle inversion    Ankle eversion    (Blank rows = not tested)  BED MOBILITY:  Sit to supine Modified independence Supine to sit Modified independence  TRANSFERS: Assistive device utilized: None   Sit to stand: Modified independence Stand to sit: Modified independence Chair to chair: Modified independence Floor: not tested  GAIT: Gait pattern: WFL Distance walked: 50 ft in clinic Assistive device utilized: None Level of assistance: Modified independence Comments: mild guarding with balance  FUNCTIONAL TESTS:  Next visit  PATIENT SURVEYS:  DHI next visit  VESTIBULAR ASSESSMENT:  GENERAL OBSERVATION: glasses; anxious about visit   SYMPTOM BEHAVIOR:  Subjective history: see above  Non-Vestibular symptoms: none  Type of dizziness: Spinning/Vertigo  Frequency: 3 times since march  Duration: varies but usually very brief  Aggravating factors: Induced by position change: lying supine  Relieving factors: head stationary, closing eyes, rest, and slow movements  Progression of symptoms: unchanged  OCULOMOTOR EXAM:  Ocular Alignment: normal  Ocular ROM: No Limitations  Spontaneous Nystagmus: absent  Gaze-Induced Nystagmus: not tested  Smooth Pursuits: not tested  Saccades: not tested  Convergence/Divergence:  cm    VESTIBULAR - OCULAR REFLEX:   Slow VOR: Comment: not tested  VOR Cancellation: Comment: not tested  Head-Impulse Test:   Dynamic Visual Acuity: not tested   POSITIONAL TESTING: Right Sidelying: upbeating, right nystagmus  MOTION SENSITIVITY:  Motion Sensitivity Quotient Intensity: 0 = none, 1 = Lightheaded, 2 = Mild, 3 = Moderate, 4 = Severe, 5 = Vomiting  Intensity  1. Sitting to supine   2. Supine to L side   3. Supine to R side   4. Supine to sitting   5. L Hallpike-Dix   6. Up from L    7. R Hallpike-Dix   8. Up from R    9. Sitting, head tipped to L knee   10. Head up from L knee   11. Sitting, head tipped to R knee   12. Head up from R knee   13. Sitting head turns x5   14.Sitting head nods x5   15. In stance, 180 turn to L    16. In stance, 180 turn to R     OTHOSTATICS: not done  FUNCTIONAL GAIT: next visit                                                                                                                              TREATMENT DATE:  10/13/2023  Therapeutic Activity: -Education on diagnosis of BPPV, epley maneuver, and prognosis.  Canalith repositioning: -  Positive R dix hall pike, with upward beating torsional nystagmus  -2 Epley manuevers performed treating R posterior canal, pt demonstrates decreased symptoms and delayed nystagmus during second treatment manuever  10/06/23 physical therapy evaluation and HEP instruction   Canalith Repositioning:  Semont Right Posterior: Number of Reps: 2 and Response to Treatment: symptoms improved Gaze Adaptation:   Habituation:   Other:   PATIENT EDUCATION: Education details: Patient educated on exam findings, POC, scope of PT, HEP, and education on BPPV. Person educated: Patient Education method: Explanation, Demonstration, and Handouts Education comprehension: verbalized understanding, returned demonstration, verbal cues required, and tactile cues required   HOME EXERCISE PROGRAM: Access Code: [email or web address] URL: [email or web address] Date: 10/06/2023 Prepared by: AP - Rehab  Patient Education - BPPV - What Is BPPV? - BPPV - BPPV - After BPPV Repositioning GOALS: Goals reviewed with patient? No  SHORT TERM GOALS: Target date: 10/20/2023  Patient will report 50% improvement overall   Baseline: Goal status: INITIAL   LONG TERM GOALS: Target date: 11/03/2023  Patient will report 90% improvement overall   Baseline:  Goal status: INITIAL  2.  Patient will be able to lie supine for hair appointment without any dizziness Baseline:  Goal status: INITIAL  3.  Patient will improve DHI score to 5 or less to demonstrate minimal dizziness Baseline:  Goal status: INITIAL  ASSESSMENT:  CLINICAL IMPRESSION:  Patient continues to demonstrate slight vertigo symptoms and up beating torsional nystagmus with right [employer]  and Epley maneuver. Patient demonstrates decreased symptoms since previous session but still demonstrates delayed nystagmus on both repetitions of Epley maneuver for R posterior canal treatment. Patient would continue to benefit from skilled physical therapy for decreased vertigo symptoms, increased understanding of treatment and prognosis for improved quality of life and continued progress towards therapy goals.   Eval: Patient is a 68 y.o. female who was seen today for physical therapy evaluation and treatment for H81.13 (ICD-10-CM) - Benign paroxysmal vertigo, bilateral. Patient demonstrates increased vestibular symptoms with provocative testing which is negatively impacting patient ability to perform ADLs and functional mobility tasks. Patient will benefit from skilled physical therapy services to address these deficits to improve level of function with ADLs, functional mobility tasks, and reduce risk for falls.    OBJECTIVE IMPAIRMENTS: Abnormal gait, decreased activity tolerance, and dizziness.   ACTIVITY LIMITATIONS: bending and hygiene/grooming  PARTICIPATION LIMITATIONS: community activity  REHAB POTENTIAL: Good  CLINICAL DECISION MAKING: Evolving/moderate complexity  EVALUATION COMPLEXITY: Moderate   PLAN:  PT FREQUENCY: 1x/week  PT DURATION: 4 weeks  PLANNED INTERVENTIONS: 97164- PT Re-evaluation, 97110-Therapeutic exercises, 97530- Therapeutic activity, [id number]- Neuromuscular re-education, 97535- Self Care, 86578- Manual therapy, [id number]- Gait training, [phone]- Orthotic Fit/training, [id number]- Canalith repositioning, [id number]- Aquatic Therapy, 97760- Splinting, [id number]- Wound care (first 20 sq cm), 97598- Wound care (each additional 20 sq cm)Patient/Family education, Balance training, Stair training, Taping, Dry Needling, Joint mobilization, Joint manipulation, Spinal manipulation, Spinal mobilization, Scar mobilization, and DME instructions.   PLAN FOR NEXT SESSION: Review HEP and goals;  recheck right posterior canal with sidelying test; if negative check left posterior canal; DHI   Armond Bertin, PT, DPT [facility] Office: [phone] 5:34 PM, 10/13/23

## 2023-10-24 DIAGNOSIS — F325 Major depressive disorder, single episode, in full remission: Secondary | ICD-10-CM | POA: Diagnosis not present

## 2023-10-24 DIAGNOSIS — E7800 Pure hypercholesterolemia, unspecified: Secondary | ICD-10-CM | POA: Diagnosis not present

## 2023-10-26 ENCOUNTER — Encounter (HOSPITAL_COMMUNITY)

## 2023-11-01 ENCOUNTER — Encounter (HOSPITAL_COMMUNITY)

## 2023-11-01 DIAGNOSIS — H35411 Lattice degeneration of retina, right eye: Secondary | ICD-10-CM | POA: Diagnosis not present

## 2023-11-01 DIAGNOSIS — D3131 Benign neoplasm of right choroid: Secondary | ICD-10-CM | POA: Diagnosis not present

## 2023-11-01 DIAGNOSIS — H2513 Age-related nuclear cataract, bilateral: Secondary | ICD-10-CM | POA: Diagnosis not present

## 2023-11-01 DIAGNOSIS — H35412 Lattice degeneration of retina, left eye: Secondary | ICD-10-CM | POA: Diagnosis not present

## 2023-11-09 ENCOUNTER — Ambulatory Visit (HOSPITAL_COMMUNITY): Attending: Internal Medicine

## 2023-11-09 DIAGNOSIS — R42 Dizziness and giddiness: Secondary | ICD-10-CM | POA: Insufficient documentation

## 2023-11-09 NOTE — Therapy (Signed)
 OUTPATIENT PHYSICAL THERAPY VESTIBULAR TREATMENT     Patient Name: Barbara Greene MRN: [id number] DOB:August 11, 1955, 68 y.o., female Today's Date: 11/09/2023  END OF SESSION:  PT End of Session - 11/09/23 0945     Visit Number 3    Number of Visits 4    Date for PT Re-Evaluation 11/03/23    Authorization Type HTN    Authorization Time Period no auth needed    PT Start Time 0940    PT Stop Time 1015    PT Time Calculation (min) 35 min    Activity Tolerance Patient tolerated treatment well    Behavior During Therapy Barbara Greene for tasks assessed/performed           Past Medical History:  Diagnosis Date   Anxiety    Depression    Hyperlipidemia    Past Surgical History:  Procedure Laterality Date   BUNIONECTOMY WITH CHILECTOMY Left 03/23/2017   Procedure: CHEVRON OSTEOTOMY WITH BUNIONECTOMY;  Surgeon: Yvone Rush, MD;  Location: [facility];  Service: Orthopedics;  Laterality: Left;   CESAREAN SECTION     COLONOSCOPY WITH PROPOFOL  N/A 06/08/2016   Procedure: COLONOSCOPY WITH PROPOFOL ;  Surgeon: Gladis MARLA Louder, MD;  Location: [facility];  Service: Endoscopy;  Laterality: N/A;   METATARSAL OSTEOTOMY WITH BUNIONECTOMY Right 03/05/2019   Procedure: CHEVRON OSTEOTOMY WITH BUNIONECTOMY;  Surgeon: Yvone Rush, MD;  Location: [facility];  Service: Orthopedics;  Laterality: Right;   Patient Active Problem List   Diagnosis Date Noted   Benign neoplasm of choroid, 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   Lattice degeneration, 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   Lattice degeneration of retina, 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   Nuclear sclerotic cataract of both eyes 10/04/2019   Hallux valgus, right 03/05/2019   Hallux valgus with bunions, right 03/05/2019   Hallux valgus with bunions, left 03/23/2017   UNEQUAL LEG LENGTH 02/02/2010   BUNIONS, BILATERAL 12/16/2009   FOOT PAIN, LEFT 12/16/2009    PCP: Signa Rush MD REFERRING PROVIDER:   Dwight Trula SQUIBB, MD none none    REFERRING DIAG: H81.13  (ICD-10-CM) - Benign paroxysmal vertigo, bilateral  THERAPY DIAG:  Dizziness and giddiness  ONSET DATE: March 2025  Rationale for Evaluation and Treatment: Rehabilitation  SUBJECTIVE:   SUBJECTIVE STATEMENT: I am so anxious about being here today; she has only had one episode of dizziness since last visit and that was at the hairdresser laying her head back to have her hair washed.  Otherwise she is very careful to avoid provacative positions.    Has had vertigo in the past maybe 3 episodes or so but has had the Epley done and resolved; has noticed continued issues with dizziness with laying her head back to get her hair done, the dentist and yoga Pt accompanied by: self  PERTINENT HISTORY: anxiety and depression Retired dietician/nutritionist  PAIN:  Are you having pain? No  PRECAUTIONS: None  RED FLAGS: None   WEIGHT BEARING RESTRICTIONS: No  FALLS: Has patient fallen in last 6 months? Yes. Number of falls 2  PLOF: Independent  PATIENT GOALS: not to be dizzy  OBJECTIVE:  Note: Objective measures were completed at Evaluation unless otherwise noted.  DIAGNOSTIC FINDINGS: na  COGNITION: Overall cognitive status: Within functional limits for tasks assessed   SENSATION: WFL  EDEMA:  No swelling   POSTURE:  rounded shoulders and forward head  Cervical ROM:  appears wnl  Active AROM (deg) eval  Flexion   Extension   Right lateral flexion   Left lateral flexion  Right rotation   Left rotation   (Blank rows = not tested)  STRENGTH: no issues noted  LOWER EXTREMITY MMT:   MMT Right eval Left eval  Hip flexion    Hip abduction    Hip adduction    Hip internal rotation    Hip external rotation    Knee flexion    Knee extension    Ankle dorsiflexion    Ankle plantarflexion    Ankle inversion    Ankle eversion    (Blank rows = not tested)  BED MOBILITY:  Sit to supine Modified independence Supine to sit Modified  independence  TRANSFERS: Assistive device utilized: None  Sit to stand: Modified independence Stand to sit: Modified independence Chair to chair: Modified independence Floor: not tested  GAIT: Gait pattern: WFL Distance walked: 50 ft in clinic Assistive device utilized: None Level of assistance: Modified independence Comments: mild guarding with balance  FUNCTIONAL TESTS:  Next visit  PATIENT SURVEYS:  DHI next visit  VESTIBULAR ASSESSMENT:  GENERAL OBSERVATION: glasses; anxious about visit   SYMPTOM BEHAVIOR:  Subjective history: see above  Non-Vestibular symptoms: none  Type of dizziness: Spinning/Vertigo  Frequency: 3 times since march  Duration: varies but usually very brief  Aggravating factors: Induced by position change: lying supine  Relieving factors: head stationary, closing eyes, rest, and slow movements  Progression of symptoms: unchanged  OCULOMOTOR EXAM:  Ocular Alignment: normal  Ocular ROM: No Limitations  Spontaneous Nystagmus: absent  Gaze-Induced Nystagmus: not tested  Smooth Pursuits: not tested  Saccades: not tested  Convergence/Divergence:  cm    VESTIBULAR - OCULAR REFLEX:   Slow VOR: Comment: not tested  VOR Cancellation: Comment: not tested  Head-Impulse Test:   Dynamic Visual Acuity: not tested   POSITIONAL TESTING: Right Sidelying: upbeating, right nystagmus  MOTION SENSITIVITY:  Motion Sensitivity Quotient Intensity: 0 = none, 1 = Lightheaded, 2 = Mild, 3 = Moderate, 4 = Severe, 5 = Vomiting  Intensity  1. Sitting to supine   2. Supine to L side   3. Supine to R side   4. Supine to sitting   5. L Hallpike-Dix   6. Up from L    7. R Hallpike-Dix   8. Up from R    9. Sitting, head tipped to L knee   10. Head up from L knee   11. Sitting, head tipped to R knee   12. Head up from R knee   13. Sitting head turns x5   14.Sitting head nods x5   15. In stance, 180 turn to L    16. In stance, 180 turn to R      OTHOSTATICS: not done  FUNCTIONAL GAIT: next visit                                                                                                                             TREATMENT DATE:  11/09/23 DHI 12/100 Horizontal canal testing + right  so treated with  Quick Li roll maneuver Right sidelying test for right posterior canal negative today Self care following maneuver and what to expect next visit  10/13/2023  Therapeutic Activity: -Education on diagnosis of BPPV, epley maneuver, and prognosis.  Canalith repositioning: -Positive R dix hall pike, with upward beating torsional nystagmus  -2 Epley manuevers performed treating R posterior canal, pt demonstrates decreased symptoms and delayed nystagmus during second treatment manuever  10/06/23 physical therapy evaluation and HEP instruction   Canalith Repositioning:  Semont Right Posterior: Number of Reps: 2 and Response to Treatment: symptoms improved Gaze Adaptation:   Habituation:   Other:   PATIENT EDUCATION: Education details: Patient educated on exam findings, POC, scope of PT, HEP, and education on BPPV. Person educated: Patient Education method: Explanation, Demonstration, and Handouts Education comprehension: verbalized understanding, returned demonstration, verbal cues required, and tactile cues required   HOME EXERCISE PROGRAM: Access Code: [email or web address] URL: [email or web address] Date: 10/06/2023 Prepared by: AP - Rehab  Patient Education - BPPV - What Is BPPV? - BPPV - BPPV - After BPPV Repositioning GOALS: Goals reviewed with patient? No  SHORT TERM GOALS: Target date: 10/20/2023  Patient will report 50% improvement overall   Baseline: Goal status: INITIAL   LONG TERM GOALS: Target date: 11/03/2023  Patient will report 90% improvement overall   Baseline:  Goal status: INITIAL  2.  Patient will be able to lie supine for hair appointment without any dizziness Baseline:   Goal status: INITIAL  3.  Patient will improve DHI score to 5 or less to demonstrate minimal dizziness Baseline:  Goal status: INITIAL  ASSESSMENT:  CLINICAL IMPRESSION: Today's session started with horizontal canal testing as she has continued to be positive with right posterior canal testing.  + right horizontal canal so performed Quick Roll Li maneuver for right horizontal canal.  After rest performed a right posterior canal test/ sidelying test which was negative today.  Discussed aftercare today and also instructed patient to try to get into supine fully when sleeping.   Patient would continue to benefit from skilled physical therapy for decreased vertigo symptoms, increased understanding of treatment and prognosis for improved quality of life and continued progress towards therapy goals.   Eval: Patient is a 68 y.o. female who was seen today for physical therapy evaluation and treatment for H81.13 (ICD-10-CM) - Benign paroxysmal vertigo, bilateral. Patient demonstrates increased vestibular symptoms with provocative testing which is negatively impacting patient ability to perform ADLs and functional mobility tasks. Patient will benefit from skilled physical therapy services to address these deficits to improve level of function with ADLs, functional mobility tasks, and reduce risk for falls.    OBJECTIVE IMPAIRMENTS: Abnormal gait, decreased activity tolerance, and dizziness.   ACTIVITY LIMITATIONS: bending and hygiene/grooming  PARTICIPATION LIMITATIONS: community activity  REHAB POTENTIAL: Good  CLINICAL DECISION MAKING: Evolving/moderate complexity  EVALUATION COMPLEXITY: Moderate   PLAN:  PT FREQUENCY: 1x/week  PT DURATION: 4 weeks  PLANNED INTERVENTIONS: 97164- PT Re-evaluation, 97110-Therapeutic exercises, 97530- Therapeutic activity, [id number]- Neuromuscular re-education, 97535- Self Care, 02859- Manual therapy, [id number]- Gait training, [phone]- Orthotic Fit/training, [phone]-  Canalith repositioning, [id number]- Aquatic Therapy, 97760- Splinting, [id number]- Wound care (first 20 sq cm), 97598- Wound care (each additional 20 sq cm)Patient/Family education, Balance training, Stair training, Taping, Dry Needling, Joint mobilization, Joint manipulation, Spinal manipulation, Spinal mobilization, Scar mobilization, and DME instructions.   PLAN FOR NEXT SESSION:   reassess next visit   11:07 AM, 11/09/23 Aminata Buffalo Small Cy Bresee MPT [facility] [place] [phone]  [phone]

## 2023-11-16 ENCOUNTER — Ambulatory Visit (HOSPITAL_COMMUNITY)

## 2023-11-16 DIAGNOSIS — R42 Dizziness and giddiness: Secondary | ICD-10-CM | POA: Diagnosis not present

## 2023-11-16 NOTE — Therapy (Signed)
 OUTPATIENT PHYSICAL THERAPY VESTIBULAR TREATMENT/PROGRESS NOTE Progress Note Reporting Period 10/06/23 to 11/16/2023  See note below for Objective Data and Assessment of Progress/Goals.   PHYSICAL THERAPY DISCHARGE SUMMARY  Visits from Start of Care: 4  Current functional level related to goals / functional outcomes: See below   Remaining deficits: See below   Education / Equipment: HEP   Patient agrees to discharge. Patient goals were partially met. Patient is being discharged due to being pleased with the current functional level.         Patient Name: TREASURE [facility] MRN: [id number] DOB:04-20-1956, 68 y.o., female Today's Date: 11/16/2023  END OF SESSION:  PT End of Session - 11/16/23 0937     Visit Number 4    Number of Visits 4    Date for PT Re-Evaluation 11/03/23    Authorization Type HTN    Authorization Time Period no auth needed    Progress Note Due on Visit 4    PT Start Time [phone]    PT Stop Time 1015    PT Time Calculation (min) 39 min    Activity Tolerance Patient tolerated treatment well    Behavior During Therapy [facility] for tasks assessed/performed           Past Medical History:  Diagnosis Date   Anxiety    Depression    Hyperlipidemia    Past Surgical History:  Procedure Laterality Date   BUNIONECTOMY WITH CHILECTOMY Left 03/23/2017   Procedure: CHEVRON OSTEOTOMY WITH BUNIONECTOMY;  Surgeon: Yvone Rush, MD;  Location: [facility];  Service: Orthopedics;  Laterality: Left;   CESAREAN SECTION     COLONOSCOPY WITH PROPOFOL  N/A 06/08/2016   Procedure: COLONOSCOPY WITH PROPOFOL ;  Surgeon: Gladis MARLA Louder, MD;  Location: [facility];  Service: Endoscopy;  Laterality: N/A;   METATARSAL OSTEOTOMY WITH BUNIONECTOMY Right 03/05/2019   Procedure: CHEVRON OSTEOTOMY WITH BUNIONECTOMY;  Surgeon: Yvone Rush, MD;  Location: [facility];  Service: Orthopedics;  Laterality: Right;   Patient Active Problem List    Diagnosis Date Noted   Benign neoplasm of choroid, 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   Lattice degeneration, 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   Lattice degeneration of retina, 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   Nuclear sclerotic cataract of both eyes 10/04/2019   Hallux valgus, right 03/05/2019   Hallux valgus with bunions, right 03/05/2019   Hallux valgus with bunions, left 03/23/2017   UNEQUAL LEG LENGTH 02/02/2010   BUNIONS, BILATERAL 12/16/2009   FOOT PAIN, LEFT 12/16/2009    PCP: Signa Rush MD REFERRING PROVIDER:   Dwight Trula SQUIBB, MD none none    REFERRING DIAG: H81.13 (ICD-10-CM) - Benign paroxysmal vertigo, bilateral  THERAPY DIAG:  Dizziness and giddiness  ONSET DATE: March 2025  Rationale for Evaluation and Treatment: Rehabilitation  SUBJECTIVE:   SUBJECTIVE STATEMENT: Have had a pretty good week; has returned to her normal sleeping position.  She is doing more of her normal activities although has not yet tried returning to the gym.  She has walked a few times outside.  75% better.  Feeling anxious today.   Has had vertigo in the past maybe 3 episodes or so but has had the Epley done and resolved; has noticed continued issues with dizziness with laying her head back to get her hair done, the dentist and yoga Pt accompanied by: self  PERTINENT HISTORY: anxiety and depression Retired dietician/nutritionist  PAIN:  Are you having pain? No  PRECAUTIONS: None  RED FLAGS: None   WEIGHT BEARING RESTRICTIONS: No  FALLS:  Has patient fallen in last 6 months? Yes. Number of falls 2  PLOF: Independent  PATIENT GOALS: not to be dizzy  OBJECTIVE:  Note: Objective measures were completed at Evaluation unless otherwise noted.  DIAGNOSTIC FINDINGS: na  COGNITION: Overall cognitive status: Within functional limits for tasks assessed   SENSATION: WFL  EDEMA:  No swelling   POSTURE:  rounded shoulders and forward head  Cervical ROM:  appears wnl  Active AROM (deg) eval  Flexion   Extension    Right lateral flexion   Left lateral flexion   Right rotation   Left rotation   (Blank rows = not tested)  STRENGTH: no issues noted  LOWER EXTREMITY MMT:   MMT Right eval Left eval  Hip flexion    Hip abduction    Hip adduction    Hip internal rotation    Hip external rotation    Knee flexion    Knee extension    Ankle dorsiflexion    Ankle plantarflexion    Ankle inversion    Ankle eversion    (Blank rows = not tested)  BED MOBILITY:  Sit to supine Modified independence Supine to sit Modified independence  TRANSFERS: Assistive device utilized: None  Sit to stand: Modified independence Stand to sit: Modified independence Chair to chair: Modified independence Floor: not tested  GAIT: Gait pattern: WFL Distance walked: 50 ft in clinic Assistive device utilized: None Level of assistance: Modified independence Comments: mild guarding with balance  FUNCTIONAL TESTS:  Next visit  PATIENT SURVEYS:  DHI next visit  VESTIBULAR ASSESSMENT:  GENERAL OBSERVATION: glasses; anxious about visit   SYMPTOM BEHAVIOR:  Subjective history: see above  Non-Vestibular symptoms: none  Type of dizziness: Spinning/Vertigo  Frequency: 3 times since march  Duration: varies but usually very brief  Aggravating factors: Induced by position change: lying supine  Relieving factors: head stationary, closing eyes, rest, and slow movements  Progression of symptoms: unchanged  OCULOMOTOR EXAM:  Ocular Alignment: normal  Ocular ROM: No Limitations  Spontaneous Nystagmus: absent  Gaze-Induced Nystagmus: not tested  Smooth Pursuits: not tested  Saccades: not tested  Convergence/Divergence:  cm    VESTIBULAR - OCULAR REFLEX:   Slow VOR: Comment: not tested  VOR Cancellation: Comment: not tested  Head-Impulse Test:   Dynamic Visual Acuity: not tested   POSITIONAL TESTING: Right Sidelying: upbeating, right nystagmus  MOTION SENSITIVITY:  Motion Sensitivity  Quotient Intensity: 0 = none, 1 = Lightheaded, 2 = Mild, 3 = Moderate, 4 = Severe, 5 = Vomiting  Intensity  1. Sitting to supine   2. Supine to L side   3. Supine to R side   4. Supine to sitting   5. L Hallpike-Dix   6. Up from L    7. R Hallpike-Dix   8. Up from R    9. Sitting, head tipped to L knee   10. Head up from L knee   11. Sitting, head tipped to R knee   12. Head up from R knee   13. Sitting head turns x5   14.Sitting head nods x5   15. In stance, 180 turn to L    16. In stance, 180 turn to R     OTHOSTATICS: not done  FUNCTIONAL GAIT: next visit  TREATMENT DATE:  11/16/23 Progress note Positive right horizontal canal so progressed with treatment Quick Li roll maneuver x 2  Review of goals and instruction in how to perform maneuvers at home if has a recurrence   11/09/23 DHI 12/100 Horizontal canal testing + right so treated with  Quick Li roll maneuver Right sidelying test for right posterior canal negative today Self care following maneuver and what to expect next visit  10/13/2023  Therapeutic Activity: -Education on diagnosis of BPPV, epley maneuver, and prognosis.  Canalith repositioning: -Positive R dix hall pike, with upward beating torsional nystagmus  -2 Epley manuevers performed treating R posterior canal, pt demonstrates decreased symptoms and delayed nystagmus during second treatment manuever  10/06/23 physical therapy evaluation and HEP instruction   Canalith Repositioning:  Semont Right Posterior: Number of Reps: 2 and Response to Treatment: symptoms improved Gaze Adaptation:   Habituation:   Other:   PATIENT EDUCATION: Education details: Patient educated on exam findings, POC, scope of PT, HEP, and education on BPPV. Person educated: Patient Education method: Explanation, Demonstration, and Handouts Education  comprehension: verbalized understanding, returned demonstration, verbal cues required, and tactile cues required   HOME EXERCISE PROGRAM: Access Code: [email or web address] URL: [email or web address] Date: 10/06/2023 Prepared by: AP - Rehab  Patient Education - BPPV - What Is BPPV? - BPPV - BPPV - After BPPV Repositioning GOALS: Goals reviewed with patient? No  SHORT TERM GOALS: Target date: 10/20/2023  Patient will report 50% improvement overall   Baseline: Goal status: MET   LONG TERM GOALS: Target date: 11/03/2023  Patient will report 90% improvement overall   Baseline: 75% better Goal status: INITIAL  2.  Patient will be able to lie supine for hair appointment without any dizziness Baseline:  Goal status: INITIAL  3.  Patient will improve DHI score to 5 or less to demonstrate minimal dizziness Baseline:  Goal status: INITIAL  ASSESSMENT:  CLINICAL IMPRESSION: Today's session started with horizontal canal testing as she was positive last visit; positive for right horizontal canal so treated x 2 with Lucillie Quick maneuver.  Tested 2nd time for horizontal canal and was negative; right posterior canal side lying test negative.  Patient comfortable with performing li quick maneuver at home so discharged today with instructions on how to self treat if she has a recurrence or to get another referral is she needs more formal therapy.    Eval: Patient is a 68 y.o. female who was seen today for physical therapy evaluation and treatment for H81.13 (ICD-10-CM) - Benign paroxysmal vertigo, bilateral. Patient demonstrates increased vestibular symptoms with provocative testing which is negatively impacting patient ability to perform ADLs and functional mobility tasks. Patient will benefit from skilled physical therapy services to address these deficits to improve level of function with ADLs, functional mobility tasks, and reduce risk for falls.    OBJECTIVE IMPAIRMENTS: Abnormal  gait, decreased activity tolerance, and dizziness.   ACTIVITY LIMITATIONS: bending and hygiene/grooming  PARTICIPATION LIMITATIONS: community activity  REHAB POTENTIAL: Good  CLINICAL DECISION MAKING: Evolving/moderate complexity  EVALUATION COMPLEXITY: Moderate   PLAN:  PT FREQUENCY: 1x/week  PT DURATION: 4 weeks  PLANNED INTERVENTIONS: [id number]- PT Re-evaluation, 97110-Therapeutic exercises, 97530- Therapeutic activity, [id number]- Neuromuscular re-education, 97535- Self Care, [id number]- Manual therapy, [id number]- Gait training, [phone]- Orthotic Fit/training, [phone]- Canalith repositioning, [id number]- Aquatic Therapy, [id number]- Splinting, [id number]- Wound care (first 20 sq cm), 97598- Wound care (each additional 20 sq cm)Patient/Family education, Balance training, Stair training, Taping, Dry Needling, Joint mobilization, Joint manipulation, Spinal manipulation, Spinal mobilization, Scar  mobilization, and DME instructions.   PLAN FOR NEXT SESSION:   discharge   10:44 AM, 11/16/23 [place] Sebastiani Small Kendra Woolford MPT [facility] [place] [phone]

## 2023-11-23 DIAGNOSIS — Z124 Encounter for screening for malignant neoplasm of cervix: Secondary | ICD-10-CM | POA: Diagnosis not present

## 2023-11-23 DIAGNOSIS — F419 Anxiety disorder, unspecified: Secondary | ICD-10-CM | POA: Diagnosis not present

## 2023-11-23 DIAGNOSIS — Z6820 Body mass index (BMI) 20.0-20.9, adult: Secondary | ICD-10-CM | POA: Diagnosis not present

## 2023-11-24 DIAGNOSIS — E7800 Pure hypercholesterolemia, unspecified: Secondary | ICD-10-CM | POA: Diagnosis not present

## 2023-11-24 DIAGNOSIS — F325 Major depressive disorder, single episode, in full remission: Secondary | ICD-10-CM | POA: Diagnosis not present

## 2023-12-25 DIAGNOSIS — F325 Major depressive disorder, single episode, in full remission: Secondary | ICD-10-CM | POA: Diagnosis not present

## 2023-12-25 DIAGNOSIS — E7800 Pure hypercholesterolemia, unspecified: Secondary | ICD-10-CM | POA: Diagnosis not present

## 2023-12-28 DIAGNOSIS — W5501XA Bitten by cat, initial encounter: Secondary | ICD-10-CM | POA: Diagnosis not present

## 2023-12-28 DIAGNOSIS — S51851A Open bite of right forearm, initial encounter: Secondary | ICD-10-CM | POA: Diagnosis not present

## 2024-01-06 DIAGNOSIS — I471 Supraventricular tachycardia, unspecified: Secondary | ICD-10-CM | POA: Diagnosis not present

## 2024-01-06 DIAGNOSIS — F419 Anxiety disorder, unspecified: Secondary | ICD-10-CM | POA: Diagnosis not present

## 2024-01-06 DIAGNOSIS — R55 Syncope and collapse: Secondary | ICD-10-CM | POA: Diagnosis not present

## 2024-01-24 DIAGNOSIS — Z1231 Encounter for screening mammogram for malignant neoplasm of breast: Secondary | ICD-10-CM | POA: Diagnosis not present

## 2024-01-24 DIAGNOSIS — E7800 Pure hypercholesterolemia, unspecified: Secondary | ICD-10-CM | POA: Diagnosis not present

## 2024-01-24 DIAGNOSIS — F325 Major depressive disorder, single episode, in full remission: Secondary | ICD-10-CM | POA: Diagnosis not present

## 2024-01-25 DIAGNOSIS — R55 Syncope and collapse: Secondary | ICD-10-CM | POA: Diagnosis not present

## 2024-01-30 DIAGNOSIS — I471 Supraventricular tachycardia, unspecified: Secondary | ICD-10-CM | POA: Diagnosis not present

## 2024-01-30 DIAGNOSIS — R55 Syncope and collapse: Secondary | ICD-10-CM | POA: Diagnosis not present

## 2024-02-24 DIAGNOSIS — E7800 Pure hypercholesterolemia, unspecified: Secondary | ICD-10-CM | POA: Diagnosis not present

## 2024-02-24 DIAGNOSIS — F325 Major depressive disorder, single episode, in full remission: Secondary | ICD-10-CM | POA: Diagnosis not present

## 2024-03-19 DIAGNOSIS — N281 Cyst of kidney, acquired: Secondary | ICD-10-CM | POA: Diagnosis not present

## 2024-03-19 DIAGNOSIS — E559 Vitamin D deficiency, unspecified: Secondary | ICD-10-CM | POA: Diagnosis not present

## 2024-03-19 DIAGNOSIS — R55 Syncope and collapse: Secondary | ICD-10-CM | POA: Diagnosis not present

## 2024-03-19 DIAGNOSIS — Z79899 Other long term (current) drug therapy: Secondary | ICD-10-CM | POA: Diagnosis not present

## 2024-03-19 DIAGNOSIS — R311 Benign essential microscopic hematuria: Secondary | ICD-10-CM | POA: Diagnosis not present

## 2024-03-19 DIAGNOSIS — Z85820 Personal history of malignant melanoma of skin: Secondary | ICD-10-CM | POA: Diagnosis not present

## 2024-03-19 DIAGNOSIS — M8580 Other specified disorders of bone density and structure, unspecified site: Secondary | ICD-10-CM | POA: Diagnosis not present

## 2024-03-19 DIAGNOSIS — E7800 Pure hypercholesterolemia, unspecified: Secondary | ICD-10-CM | POA: Diagnosis not present

## 2024-03-19 DIAGNOSIS — F419 Anxiety disorder, unspecified: Secondary | ICD-10-CM | POA: Diagnosis not present

## 2024-03-25 DIAGNOSIS — F325 Major depressive disorder, single episode, in full remission: Secondary | ICD-10-CM | POA: Diagnosis not present

## 2024-03-25 DIAGNOSIS — E7800 Pure hypercholesterolemia, unspecified: Secondary | ICD-10-CM | POA: Diagnosis not present

## 2024-04-04 DIAGNOSIS — R748 Abnormal levels of other serum enzymes: Secondary | ICD-10-CM | POA: Diagnosis not present
